# Patient Record
Sex: Female | Born: 1995 | Race: White | Hispanic: No | Marital: Married | Smoking: Never smoker
Health system: Southern US, Community
[De-identification: ages and names within clinical notes are randomized; demographics above are authoritative.]

## PROBLEM LIST (undated history)

## (undated) DIAGNOSIS — F419 Anxiety disorder, unspecified: Secondary | ICD-10-CM

## (undated) DIAGNOSIS — F32A Depression, unspecified: Secondary | ICD-10-CM

## (undated) DIAGNOSIS — Z01118 Encounter for examination of ears and hearing with other abnormal findings: Secondary | ICD-10-CM

## (undated) HISTORY — DX: Depression, unspecified: F32.A

## (undated) HISTORY — DX: Anxiety disorder, unspecified: F41.9

## (undated) HISTORY — DX: Encounter for examination of ears and hearing with other abnormal findings: Z01.118

---

## 2019-05-09 ENCOUNTER — Ambulatory Visit (LOCAL_COMMUNITY_HEALTH_CENTER): Payer: 59

## 2019-05-09 ENCOUNTER — Other Ambulatory Visit: Payer: Self-pay

## 2019-05-09 VITALS — BP 112/80 | Ht 61.0 in | Wt 137.0 lb

## 2019-05-09 DIAGNOSIS — Z30013 Encounter for initial prescription of injectable contraceptive: Secondary | ICD-10-CM

## 2019-05-09 DIAGNOSIS — Z3009 Encounter for other general counseling and advice on contraception: Secondary | ICD-10-CM

## 2019-05-09 MED ORDER — MEDROXYPROGESTERONE ACETATE 150 MG/ML IM SUSP
150.0000 mg | Freq: Once | INTRAMUSCULAR | Status: AC
  Administered 2019-05-09: 150 mg via INTRAMUSCULAR

## 2019-05-09 NOTE — Progress Notes (Signed)
When specifically asked, client denied allergy to medicine or food but per Epic, Penicillin allergy. Yellow sticky noted to verify at next appt. Depo administered without difficulty per 06/29/2019 order of Antoine Primas PA. Client tolerated Depo without complaint. Folic acid counseling completed. Per client, has full bottle at home that she previously received from [facility]. Encouraged to take ond MVI daily with food. Rich Number, RN

## 2019-07-26 ENCOUNTER — Other Ambulatory Visit: Payer: Self-pay

## 2019-07-26 ENCOUNTER — Ambulatory Visit (LOCAL_COMMUNITY_HEALTH_CENTER): Payer: 59 | Admitting: Advanced Practice Midwife

## 2019-07-26 ENCOUNTER — Encounter: Payer: Self-pay | Admitting: Advanced Practice Midwife

## 2019-07-26 VITALS — BP 132/82 | Ht 63.0 in | Wt 136.0 lb

## 2019-07-26 DIAGNOSIS — F329 Major depressive disorder, single episode, unspecified: Secondary | ICD-10-CM | POA: Insufficient documentation

## 2019-07-26 DIAGNOSIS — Z30013 Encounter for initial prescription of injectable contraceptive: Secondary | ICD-10-CM

## 2019-07-26 DIAGNOSIS — Z3042 Encounter for surveillance of injectable contraceptive: Secondary | ICD-10-CM

## 2019-07-26 DIAGNOSIS — Z3009 Encounter for other general counseling and advice on contraception: Secondary | ICD-10-CM

## 2019-07-26 DIAGNOSIS — F32A Depression, unspecified: Secondary | ICD-10-CM

## 2019-07-26 MED ORDER — MEDROXYPROGESTERONE ACETATE 150 MG/ML IM SUSP
150.0000 mg | Freq: Once | INTRAMUSCULAR | Status: AC
  Administered 2019-07-26: 150 mg via INTRAMUSCULAR

## 2019-07-26 NOTE — Progress Notes (Signed)
Patient here for on time Depo; needs to see provider for order. Aileen Fass, RN

## 2019-07-26 NOTE — Progress Notes (Signed)
   [place] problem visit  [place] [facility]  Subjective:  Tasha Foster is a 23 y.o. being seen today for DMPA  Chief Complaint  Patient presents with  . Contraception    Depo    HPI  LMP 07/17/19.  Last sex 07/11/19.  Nonsmoker.  Last pap 04/2018 Tasha Foster Does the patient have a current or past history of drug use? No   No components found for: HCV]   Health Maintenance Due  Topic Date Due  . HIV Screening  09/05/2010  . TETANUS/TDAP  09/05/2014  . PAP-Cervical Cytology Screening  09/05/2016  . PAP SMEAR-Modifier  09/05/2016  . INFLUENZA VACCINE  04/14/2019    ROS  The following portions of the patient's history were reviewed and updated as appropriate: allergies, current medications, past family history, past medical history, past social history, past surgical history and problem list. Problem list updated.   See flowsheet for other program required questions.  Objective:   Vitals:   07/26/19 1030  BP: 132/82  Weight: 136 lb (61.7 kg)  Height: 5\' 3"  (1.6 m)    Physical Exam  n/a  Assessment and Plan:  Tasha Foster is a 23 y.o. female presenting to the [facility] for a Women's Health problem visit  1. Family planning ROI for pap 04/2018 Tasha Foster May have DMPA 150 mg IM x1 - medroxyPROGESTERone (DEPO-PROVERA) injection 150 mg  2. Encounter for surveillance of injectable contraceptive Happy with DMPA     No follow-ups on file.  No future appointments.  Herbie Saxon, CNM

## 2019-07-27 NOTE — Progress Notes (Signed)
Pt received Depo 150mg  IM per provider order and pt tolerated well. ROI for [facility] obtained and signed by pt per provider order. ROI faxed and fax confirmation received. Provider orders completed.Ronny Bacon, RN

## 2019-08-23 ENCOUNTER — Encounter: Payer: Self-pay | Admitting: Physician Assistant

## 2019-08-23 LAB — HM PAP SMEAR

## 2019-10-11 ENCOUNTER — Other Ambulatory Visit: Payer: Self-pay | Admitting: Physician Assistant

## 2019-10-11 DIAGNOSIS — Z3042 Encounter for surveillance of injectable contraceptive: Secondary | ICD-10-CM

## 2019-10-11 MED ORDER — MEDROXYPROGESTERONE ACETATE 150 MG/ML IM SUSP
150.0000 mg | INTRAMUSCULAR | Status: DC
  Administered 2019-10-12: 08:00:00 150 mg via INTRAMUSCULAR

## 2019-10-11 NOTE — Progress Notes (Signed)
Per chart, last RP 06/2018 with pap at [facility] 2019 and next due 2023.  Provided that patient desires to continue with Depo and BP is normal, ok to continue Depo for 1 year.

## 2019-10-12 ENCOUNTER — Ambulatory Visit (LOCAL_COMMUNITY_HEALTH_CENTER): Payer: Medicaid Other

## 2019-10-12 ENCOUNTER — Other Ambulatory Visit: Payer: Self-pay

## 2019-10-12 VITALS — BP 127/78 | Ht 63.0 in | Wt 131.0 lb

## 2019-10-12 DIAGNOSIS — Z3042 Encounter for surveillance of injectable contraceptive: Secondary | ICD-10-CM

## 2019-10-12 DIAGNOSIS — Z30013 Encounter for initial prescription of injectable contraceptive: Secondary | ICD-10-CM | POA: Diagnosis not present

## 2019-10-12 DIAGNOSIS — Z3009 Encounter for other general counseling and advice on contraception: Secondary | ICD-10-CM

## 2019-10-12 NOTE — Progress Notes (Signed)
Depo given per C. Rolley Sims PA order; tolerated well Richmond Campbell, RN

## 2020-01-02 ENCOUNTER — Other Ambulatory Visit: Payer: Self-pay

## 2020-01-02 ENCOUNTER — Ambulatory Visit (LOCAL_COMMUNITY_HEALTH_CENTER): Payer: 59

## 2020-01-02 DIAGNOSIS — Z3042 Encounter for surveillance of injectable contraceptive: Secondary | ICD-10-CM

## 2020-01-02 DIAGNOSIS — Z30013 Encounter for initial prescription of injectable contraceptive: Secondary | ICD-10-CM | POA: Diagnosis not present

## 2020-01-02 DIAGNOSIS — Z3009 Encounter for other general counseling and advice on contraception: Secondary | ICD-10-CM

## 2020-01-02 NOTE — Progress Notes (Signed)
Depo given (left delt) per Rolley Sims order; tolerated well Richmond Campbell, RN

## 2020-03-26 ENCOUNTER — Ambulatory Visit (INDEPENDENT_AMBULATORY_CARE_PROVIDER_SITE_OTHER): Payer: 59 | Admitting: Obstetrics and Gynecology

## 2020-03-26 ENCOUNTER — Other Ambulatory Visit: Payer: Self-pay

## 2020-03-26 ENCOUNTER — Encounter: Payer: Self-pay | Admitting: Obstetrics and Gynecology

## 2020-03-26 VITALS — BP 158/89 | HR 112 | Ht 60.0 in | Wt 130.0 lb

## 2020-03-26 DIAGNOSIS — Z3169 Encounter for other general counseling and advice on procreation: Secondary | ICD-10-CM | POA: Diagnosis not present

## 2020-03-26 NOTE — Progress Notes (Signed)
HPI:      [name] is a 24 y.o. No obstetric history on file. who LMP was No LMP recorded. Patient has had an injection.  Subjective:   She presents today because she has been using Depo for multiple years consecutively and has now decided to attempt pregnancy.  She would like preconception counseling and would like to discuss ovulation prediction and timing of intercourse for pregnancy. She has not previously been pregnant and her partner has never fathered a child.  Prior to Depo she had regular cycles. Since discontinuation of Depo in April she has had 1 menstrual period.    Hx: The following portions of the patient's history were reviewed and updated as appropriate:             She  has a past medical history of Anxiety and Depression. She does not have any pertinent problems on file. She  has no past surgical history on file. Her family history includes Clotting disorder in her paternal grandfather; Diabetes in her paternal grandfather; Hepatitis in her mother; Hypertension in her father and paternal grandfather; Migraines in her father, maternal grandfather, maternal grandmother, mother, paternal grandfather, and paternal grandmother; Mood Disorder in her father; Seizures in her mother. She  reports that she has never smoked. She has never used smokeless tobacco. She reports current alcohol use. She reports previous drug use. She has a current medication list which includes the following prescription(s): hydroxyzine and venlafaxine xr, and the following Facility-Administered Medications: medroxyprogesterone. She is allergic to amoxicillin and penicillins.       Review of Systems:  Review of Systems  Constitutional: Denied constitutional symptoms, night sweats, recent illness, fatigue, fever, insomnia and weight loss.  Eyes: Denied eye symptoms, eye pain, photophobia, vision change and visual disturbance.  Ears/Nose/Throat/Neck: Denied ear, nose, throat or neck symptoms,  hearing loss, nasal discharge, sinus congestion and sore throat.  Cardiovascular: Denied cardiovascular symptoms, arrhythmia, chest pain/pressure, edema, exercise intolerance, orthopnea and palpitations.  Respiratory: Denied pulmonary symptoms, asthma, pleuritic pain, productive sputum, cough, dyspnea and wheezing.  Gastrointestinal: Denied, gastro-esophageal reflux, melena, nausea and vomiting.  Genitourinary: Denied genitourinary symptoms including symptomatic vaginal discharge, pelvic relaxation issues, and urinary complaints.  Musculoskeletal: Denied musculoskeletal symptoms, stiffness, swelling, muscle weakness and myalgia.  Dermatologic: Denied dermatology symptoms, rash and scar.  Neurologic: Denied neurology symptoms, dizziness, headache, neck pain and syncope.  Psychiatric: Denied psychiatric symptoms, anxiety and depression.  Endocrine: Denied endocrine symptoms including hot flashes and night sweats.   Meds:   Current Outpatient Medications on File Prior to Visit  Medication Sig Dispense Refill  . hydrOXYzine (ATARAX/VISTARIL) 50 MG tablet SMARTSIG:0.5 Tablet(s) By Mouth 1 to 3 Times Daily PRN    . venlafaxine XR (EFFEXOR-XR) 37.5 MG 24 hr capsule Take by mouth.     Current Facility-Administered Medications on File Prior to Visit  Medication Dose Route Frequency Provider Last Rate Last Admin  . medroxyPROGESTERone (DEPO-PROVERA) injection 150 mg  150 mg Intramuscular Q90 days Matt Holmes, [place]   150 mg at 10/12/19 6270    Objective:     Vitals:   03/26/20 1010  BP: (!) 158/89  Pulse: (!) 112                Assessment:    No obstetric history on file. Patient Active Problem List   Diagnosis Date Noted  . Depression/anxiety 07/26/2019     1. Pre-conception counseling     Likely her first period was not ovulatory.  But  will employ expectant management with the expectation that within the next 6 months that she will have normal monthly menses.   Plan:             1.  Preconceptual counseling.  We have discussed the timing of intercourse, how to count cycle days, when the most fertile days occur, how to use ovulation predictor kits, how to increase sperm counts by intercourse every other day, possible future work-up if cycles do not become regular, the effect of Depo on future ovulations, use of prenatal vitamins prior to conception, first trimester prenatal care. All questions answered. Orders No orders of the defined types were placed in this encounter.   No orders of the defined types were placed in this encounter.     F/U  Return in about 6 months (around 09/26/2020). I spent 33 minutes involved in the care of this patient preparing to see the patient by obtaining and reviewing her medical history (including labs, imaging tests and prior procedures), documenting clinical information in the electronic health record (EHR), counseling and coordinating care plans, writing and sending prescriptions, ordering tests or procedures and directly communicating with the patient by discussing pertinent items from her history and physical exam as well as detailing my assessment and plan as noted above so that she has an informed understanding.  All of her questions were answered.  Elonda Husky, M.D. 03/26/2020 11:03 AM

## 2020-05-02 NOTE — Telephone Encounter (Signed)
Pt sent in an appt request. Called pt and made lab appt. The pt is requesting a call back from the nurse before her lab appt on Tuesday. I told her I will send a message to the nurse. The pt verbally understood. Please advise

## 2020-05-05 NOTE — Telephone Encounter (Signed)
Scheduled patient to come in to discuss questions with Dr. Logan Bores.

## 2020-05-06 ENCOUNTER — Ambulatory Visit (INDEPENDENT_AMBULATORY_CARE_PROVIDER_SITE_OTHER): Payer: 59 | Admitting: Obstetrics and Gynecology

## 2020-05-06 ENCOUNTER — Other Ambulatory Visit: Payer: 59

## 2020-05-06 ENCOUNTER — Other Ambulatory Visit: Payer: Self-pay

## 2020-05-06 ENCOUNTER — Encounter: Payer: Self-pay | Admitting: Obstetrics and Gynecology

## 2020-05-06 VITALS — BP 125/83 | HR 111 | Ht 60.0 in | Wt 140.0 lb

## 2020-05-06 DIAGNOSIS — R7989 Other specified abnormal findings of blood chemistry: Secondary | ICD-10-CM

## 2020-05-06 DIAGNOSIS — R799 Abnormal finding of blood chemistry, unspecified: Secondary | ICD-10-CM | POA: Diagnosis not present

## 2020-05-06 NOTE — Progress Notes (Signed)
HPI:      Tasha Foster is a 24 y.o. No obstetric history on file. who LMP was No LMP recorded. Patient has had an injection.  Subjective:   She presents today because she has been attempting pregnancy and has used ovulation predictor kit.  She states that every day of her cycle shows elevated fertility. (Ovulation predictor kits measure luteinizing hormone -thus the assumption is that she has persistent elevated LH.) She does state that she is having menstrual periods 1 time per month.    Hx: The following portions of the patient's history were reviewed and updated as appropriate:             She  has a past medical history of Anxiety and Depression. She does not have any pertinent problems on file. She  has no past surgical history on file. Her family history includes Clotting disorder in her paternal grandfather; Diabetes in her paternal grandfather; Hepatitis in her mother; Hypertension in her father and paternal grandfather; Migraines in her father, maternal grandfather, maternal grandmother, mother, paternal grandfather, and paternal grandmother; Mood Disorder in her father; Seizures in her mother. She  reports that she has never smoked. She has never used smokeless tobacco. She reports current alcohol use. She reports previous drug use. She has a current medication list which includes the following prescription(s): hydroxyzine and venlafaxine xr, and the following Facility-Administered Medications: medroxyprogesterone. She is allergic to amoxicillin and penicillins.       Review of Systems:  Review of Systems  Constitutional: Denied constitutional symptoms, night sweats, recent illness, fatigue, fever, insomnia and weight loss.  Eyes: Denied eye symptoms, eye pain, photophobia, vision change and visual disturbance.  Ears/Nose/Throat/Neck: Denied ear, nose, throat or neck symptoms, hearing loss, nasal discharge, sinus congestion and sore throat.  Cardiovascular: Denied  cardiovascular symptoms, arrhythmia, chest pain/pressure, edema, exercise intolerance, orthopnea and palpitations.  Respiratory: Denied pulmonary symptoms, asthma, pleuritic pain, productive sputum, cough, dyspnea and wheezing.  Gastrointestinal: Denied, gastro-esophageal reflux, melena, nausea and vomiting.  Genitourinary: Denied genitourinary symptoms including symptomatic vaginal discharge, pelvic relaxation issues, and urinary complaints.  Musculoskeletal: Denied musculoskeletal symptoms, stiffness, swelling, muscle weakness and myalgia.  Dermatologic: Denied dermatology symptoms, rash and scar.  Neurologic: Denied neurology symptoms, dizziness, headache, neck pain and syncope.  Psychiatric: Denied psychiatric symptoms, anxiety and depression.  Endocrine: Denied endocrine symptoms including hot flashes and night sweats.   Meds:   Current Outpatient Medications on File Prior to Visit  Medication Sig Dispense Refill  . hydrOXYzine (ATARAX/VISTARIL) 50 MG tablet SMARTSIG:0.5 Tablet(s) By Mouth 1 to 3 Times Daily PRN    . venlafaxine XR (EFFEXOR-XR) 37.5 MG 24 hr capsule Take by mouth.     Current Facility-Administered Medications on File Prior to Visit  Medication Dose Route Frequency Provider Last Rate Last Admin  . medroxyPROGESTERone (DEPO-PROVERA) injection 150 mg  150 mg Intramuscular Q90 days Jerene Dilling, [place]   150 mg at 10/12/19 3491    Objective:     Vitals:   05/06/20 0850  BP: 125/83  Pulse: (!) 111                Assessment:    No obstetric history on file. Patient Active Problem List   Diagnosis Date Noted  . Depression/anxiety 07/26/2019     1. Abnormal pituitary luteinizing hormone (LH)     Assumption because ovulation predictor kit shows constant fertility.   Plan:            1.  PCO labs because this is a common reason for elevated LH.  2.  If PCO labs normal consider timing intercourse based only on cycle.  Then performed progesterone level in  luteal phase to confirm ovulation.  We have discussed PCO in detail.  Possible treatment options discussed.  All questions answered. Orders Orders Placed This Encounter  Procedures  . Testosterone,Free and Total  . TSH  . Glucose, random  . Prolactin  . FSH  . LH  . DHEA-sulfate  . Insulin, random    No orders of the defined types were placed in this encounter.     F/U  Return for We will contact her with any abnormal test results. I spent 22 minutes involved in the care of this patient preparing to see the patient by obtaining and reviewing her medical history (including labs, imaging tests and prior procedures), documenting clinical information in the electronic health record (EHR), counseling and coordinating care plans, writing and sending prescriptions, ordering tests or procedures and directly communicating with the patient by discussing pertinent items from her history and physical exam as well as detailing my assessment and plan as noted above so that she has an informed understanding.  All of her questions were answered.  Finis Bud, M.D. 05/06/2020 9:22 AM

## 2020-05-10 LAB — TSH: TSH: 0.607 u[IU]/mL (ref 0.450–4.500)

## 2020-05-10 LAB — LUTEINIZING HORMONE: LH: 6.7 m[IU]/mL

## 2020-05-10 LAB — TESTOSTERONE,FREE AND TOTAL
Testosterone, Free: 4.2 pg/mL (ref 0.0–4.2)
Testosterone: 29 ng/dL (ref 13–71)

## 2020-05-10 LAB — DHEA-SULFATE: DHEA-SO4: 210 ug/dL (ref 110.0–431.7)

## 2020-05-10 LAB — INSULIN, RANDOM: INSULIN: 18.1 u[IU]/mL (ref 2.6–24.9)

## 2020-05-10 LAB — GLUCOSE, RANDOM: Glucose: 94 mg/dL (ref 65–99)

## 2020-05-10 LAB — FOLLICLE STIMULATING HORMONE: FSH: 5.6 m[IU]/mL

## 2020-05-14 NOTE — Telephone Encounter (Signed)
Pt called in and stated that she got her lab work back on [place], and that she would like a call back from the nurse. I told the pt I will send a message to the nurse and someone will be in touched. Please advise

## 2020-07-29 ENCOUNTER — Other Ambulatory Visit: Payer: 59

## 2020-07-29 ENCOUNTER — Other Ambulatory Visit: Payer: Self-pay

## 2020-07-29 DIAGNOSIS — R7989 Other specified abnormal findings of blood chemistry: Secondary | ICD-10-CM

## 2020-07-29 DIAGNOSIS — Z3009 Encounter for other general counseling and advice on contraception: Secondary | ICD-10-CM

## 2020-07-30 LAB — PROGESTERONE: Progesterone: 0.8 ng/mL

## 2020-08-04 ENCOUNTER — Encounter: Payer: 59 | Admitting: Obstetrics and Gynecology

## 2020-08-05 ENCOUNTER — Encounter: Payer: 59 | Admitting: Obstetrics and Gynecology

## 2020-09-13 NOTE — L&D Delivery Note (Signed)
Delivery Summary for Kewanna Calbert  Labor Events:   Preterm labor: No data found  Rupture date: 05/02/2021  Rupture time: 9:07 AM  Rupture type: Artificial  Fluid Color: Clear White Bloody  Induction: No data found  Augmentation: No data found  Complications: No data found  Cervical ripening: No data found No data found   No data found     Delivery:   Episiotomy: No data found  Lacerations: No data found  Repair suture: No data found  Repair # of packets: No data found  Blood loss (ml): No data found   Information for the patient's newborn:  Esabella, Stockinger [[id number]]   Delivery 05/02/2021 6:26 PM by  C-Section, Low Transverse Sex:  female Gestational Age: [redacted]w[redacted]d Delivery Clinician:   Living?:         APGARS  One minute Five minutes Ten minutes  Skin color:        Heart rate:        Grimace:        Muscle tone:        Breathing:        Totals: 7  8      Presentation/position:      Resuscitation:   Cord information:    Disposition of cord blood:     Blood gases sent?  Complications:   Placenta: Delivered:       appearance Newborn Measurements: Weight: 6 lb 7.7 oz (2940 g)  Height: 19.29"  Head circumference:    Chest circumference:    Other providers:    Additional  information: Forceps:   Vacuum:   Breech:   Observed anomalies       See Dr. Oretha Milch operative note for details of procedure.    Hildred Laser, MD [facility]

## 2020-09-18 ENCOUNTER — Other Ambulatory Visit: Payer: Self-pay

## 2020-09-18 ENCOUNTER — Ambulatory Visit (INDEPENDENT_AMBULATORY_CARE_PROVIDER_SITE_OTHER): Payer: 59 | Admitting: Obstetrics and Gynecology

## 2020-09-18 ENCOUNTER — Encounter: Payer: Self-pay | Admitting: Obstetrics and Gynecology

## 2020-09-18 VITALS — BP 143/95 | HR 120 | Ht 60.0 in | Wt 154.8 lb

## 2020-09-18 DIAGNOSIS — N926 Irregular menstruation, unspecified: Secondary | ICD-10-CM

## 2020-09-18 LAB — POCT URINE PREGNANCY: Preg Test, Ur: POSITIVE — AB

## 2020-09-18 NOTE — Addendum Note (Signed)
Addended by: Elonda Husky on: 09/18/2020 11:04 AM   Modules accepted: Orders

## 2020-09-18 NOTE — Progress Notes (Signed)
HPI:      [name] is a 25 y.o. G1P0 who LMP was Patient's last menstrual period was 08/07/2020.  Subjective:   She presents today after missing her menstrual period and having a home positive pregnancy test.  She was attempting pregnancy for the last few months.  She has been using ovulation predictor kits.  She is very excited about being pregnant.  Obviously this is a planned pregnancy.  She is currently taking prenatal vitamins. Patient is experiencing daily nausea. Patient has not been vaccinated for Covid.    Hx: The following portions of the patient's history were reviewed and updated as appropriate:             She  has a past medical history of Anxiety and Depression. She does not have any pertinent problems on file. She  has no past surgical history on file. Her family history includes Clotting disorder in her paternal grandfather; Diabetes in her paternal grandfather; Hepatitis in her mother; Hypertension in her father and paternal grandfather; Migraines in her father, maternal grandfather, maternal grandmother, mother, paternal grandfather, and paternal grandmother; Mood Disorder in her father; Seizures in her mother. She  reports that she has never smoked. She has never used smokeless tobacco. She reports current alcohol use. She reports previous drug use. She has a current medication list which includes the following prescription(s): multivitamin-prenatal. She is allergic to amoxicillin and penicillins.       Review of Systems:  Review of Systems  Constitutional: Denied constitutional symptoms, night sweats, recent illness, fatigue, fever, insomnia and weight loss.  Eyes: Denied eye symptoms, eye pain, photophobia, vision change and visual disturbance.  Ears/Nose/Throat/Neck: Denied ear, nose, throat or neck symptoms, hearing loss, nasal discharge, sinus congestion and sore throat.  Cardiovascular: Denied cardiovascular symptoms, arrhythmia, chest pain/pressure,  edema, exercise intolerance, orthopnea and palpitations.  Respiratory: Denied pulmonary symptoms, asthma, pleuritic pain, productive sputum, cough, dyspnea and wheezing.  Gastrointestinal: Denied, gastro-esophageal reflux, melena, nausea and vomiting.  Genitourinary: Denied genitourinary symptoms including symptomatic vaginal discharge, pelvic relaxation issues, and urinary complaints.  Musculoskeletal: Denied musculoskeletal symptoms, stiffness, swelling, muscle weakness and myalgia.  Dermatologic: Denied dermatology symptoms, rash and scar.  Neurologic: Denied neurology symptoms, dizziness, headache, neck pain and syncope.  Psychiatric: Denied psychiatric symptoms, anxiety and depression.  Endocrine: Denied endocrine symptoms including hot flashes and night sweats.   Meds:   Current Outpatient Medications on File Prior to Visit  Medication Sig Dispense Refill  . Prenatal Vit-Fe Fumarate-FA (MULTIVITAMIN-PRENATAL) 27-0.8 MG TABS tablet Take 1 tablet by mouth daily at 12 noon.     No current facility-administered medications on file prior to visit.          Objective:     Vitals:   09/18/20 0828  BP: (!) 143/95  Pulse: (!) 120   Filed Weights   09/18/20 0828  Weight: 154 lb 12.8 oz (70.2 kg)              Urinary pregnancy test positive  Assessment:    G1P0 Patient Active Problem List   Diagnosis Date Noted  . Depression/anxiety 07/26/2019     1. Missed period     Positive pregnancy test-patient was attempting.  Approximately 6 weeks estimated gestational age based on last menstrual period.   Plan:            Prenatal Plan 1.  The patient was given prenatal literature. 2.  She was continued on prenatal vitamins. 3.  A prenatal lab panel  to be drawn at nurse visit. 4.  An ultrasound was ordered to better determine an EDC. 5.  A nurse visit was scheduled. 6.  Genetic testing and testing for other inheritable conditions discussed in detail. She will decide in  the future whether to have these labs performed. 7.  A general overview of pregnancy testing, visit schedule, ultrasound schedule, and prenatal care was discussed. 8.  COVID and its risks associated with pregnancy, prevention by limiting exposure and use of masks, as well as the risks and benefits of vaccination during pregnancy were discussed in detail.  [facility] policy regarding office and hospital visitation and testing was explained. 9.  Benefits of breast-feeding discussed in detail including both maternal and infant benefits. Ready Set Baby website discussed. 10.  Nausea and vomiting of pregnancy discussed-literature given.   Orders Orders Placed This Encounter  Procedures  . POCT urine pregnancy    No orders of the defined types were placed in this encounter.     F/U  Return in about 6 weeks (around 10/30/2020). I spent 24 minutes involved in the care of this patient preparing to see the patient by obtaining and reviewing her medical history (including labs, imaging tests and prior procedures), documenting clinical information in the electronic health record (EHR), counseling and coordinating care plans, writing and sending prescriptions, ordering tests or procedures and directly communicating with the patient by discussing pertinent items from her history and physical exam as well as detailing my assessment and plan as noted above so that she has an informed understanding.  All of her questions were answered.  Elonda Husky, M.D. 09/18/2020 9:00 AM

## 2020-09-24 ENCOUNTER — Ambulatory Visit (INDEPENDENT_AMBULATORY_CARE_PROVIDER_SITE_OTHER): Payer: 59

## 2020-09-24 ENCOUNTER — Other Ambulatory Visit: Payer: Self-pay

## 2020-09-24 DIAGNOSIS — Z3A01 Less than 8 weeks gestation of pregnancy: Secondary | ICD-10-CM

## 2020-09-24 DIAGNOSIS — N926 Irregular menstruation, unspecified: Secondary | ICD-10-CM | POA: Diagnosis not present

## 2020-10-10 ENCOUNTER — Other Ambulatory Visit: Payer: Self-pay

## 2020-10-10 ENCOUNTER — Ambulatory Visit: Payer: 59

## 2020-10-10 VITALS — BP 128/83 | HR 95 | Ht 60.0 in | Wt 153.2 lb

## 2020-10-10 DIAGNOSIS — Z3A09 9 weeks gestation of pregnancy: Secondary | ICD-10-CM

## 2020-10-10 NOTE — Progress Notes (Unsigned)
Tasha Foster presents for NOB nurse interview visit. LMP 08/07/20.Pregnancy confirmation done 09/18/20- Dr Logan Bores. Dating and viability ultrasound 09/24/20- 6 weeks EDD 05/20/21.  G- 1.  P- 0 0 0 0   . Pregnancy education material explained and given. 0 cats in the home. NOB labs ordered. TSH/HbgA1c due to Increased BMI. HIV labs and Drug screen were explained optional and she did not decline. Drug screen ordered. PNV encouraged. Genetic screening options discussed. Genetic testing: to be ordered at [facility] per patient.  Pt may discuss with provider. Pt. To follow up with provider in 3  weeks for NOB physical 10/30/20.  All questions answered. FMLA paperwork explained and signed. Financial policy reviewed and understood.

## 2020-10-11 LAB — RPR: RPR Ser Ql: NONREACTIVE

## 2020-10-11 LAB — VIRAL HEPATITIS HBV, HCV
HCV Ab: <0.1 s/co ratio (ref 0.0–0.9)
Hep B Core Total Ab: NEGATIVE
Hep B Surface Ab, Qual: NONREACTIVE
Hepatitis B Surface Ag: NEGATIVE

## 2020-10-11 LAB — CBC WITH DIFFERENTIAL
Basophils Absolute: 0.1 10*3/uL (ref 0.0–0.2)
Basos: 1 %
EOS (ABSOLUTE): 0.1 10*3/uL (ref 0.0–0.4)
Eos: 2 %
Hematocrit: 45 % (ref 34.0–46.6)
Hemoglobin: 15.1 g/dL (ref 11.1–15.9)
Immature Grans (Abs): 0.1 10*3/uL (ref 0.0–0.1)
Immature Granulocytes: 1 %
Lymphocytes Absolute: 1.4 10*3/uL (ref 0.7–3.1)
Lymphs: 16 %
MCH: 30.6 pg (ref 26.6–33.0)
MCHC: 33.6 g/dL (ref 31.5–35.7)
MCV: 91 fL (ref 79–97)
Monocytes Absolute: 0.9 10*3/uL (ref 0.1–0.9)
Monocytes: 10 %
Neutrophils Absolute: 6.4 10*3/uL (ref 1.4–7.0)
Neutrophils: 70 %
RBC: 4.93 x10E6/uL (ref 3.77–5.28)
RDW: 12.4 % (ref 11.7–15.4)
WBC: 8.9 10*3/uL (ref 3.4–10.8)

## 2020-10-11 LAB — URINALYSIS, ROUTINE W REFLEX MICROSCOPIC
Bilirubin, UA: NEGATIVE
Glucose, UA: NEGATIVE
Ketones, UA: NEGATIVE
Leukocytes,UA: NEGATIVE
Nitrite, UA: NEGATIVE
Protein,UA: NEGATIVE
RBC, UA: NEGATIVE
Specific Gravity, UA: 1.024 (ref 1.005–1.030)
Urobilinogen, Ur: 0.2 mg/dL (ref 0.2–1.0)
pH, UA: 6.5 (ref 5.0–7.5)

## 2020-10-11 LAB — HIV ANTIBODY (ROUTINE TESTING W REFLEX): HIV Screen 4th Generation wRfx: NONREACTIVE

## 2020-10-11 LAB — TSH: TSH: 0.559 u[IU]/mL (ref 0.450–4.500)

## 2020-10-11 LAB — RUBELLA SCREEN: Rubella Antibodies, IGG: 2.89 index (ref >0.99)

## 2020-10-11 LAB — HEMOGLOBIN A1C
Est. average glucose Bld gHb Est-mCnc: 100 mg/dL
Hgb A1c MFr Bld: 5.1 % (ref 4.8–5.6)

## 2020-10-11 LAB — ABO AND RH: Rh Factor: POSITIVE

## 2020-10-11 LAB — HCV INTERPRETATION

## 2020-10-11 LAB — VARICELLA ZOSTER ANTIBODY, IGG: Varicella zoster IgG: <135 index — ABNORMAL LOW (ref >165)

## 2020-10-11 LAB — ANTIBODY SCREEN: Antibody Screen: NEGATIVE

## 2020-10-12 LAB — GC/CHLAMYDIA PROBE AMP
Chlamydia trachomatis, NAA: NEGATIVE
Neisseria Gonorrhoeae by PCR: NEGATIVE

## 2020-10-14 LAB — URINE CULTURE

## 2020-10-28 LAB — MONITOR DRUG PROFILE 14(MW)
Amphetamine Scrn, Ur: NEGATIVE ng/mL
BARBITURATE SCREEN URINE: NEGATIVE ng/mL
BENZODIAZEPINE SCREEN, URINE: NEGATIVE ng/mL
Buprenorphine, Urine: NEGATIVE ng/mL
Cocaine (Metab) Scrn, Ur: NEGATIVE ng/mL
Creatinine(Crt), U: 200.7 mg/dL (ref 20.0–300.0)
Fentanyl, Urine: NEGATIVE pg/mL
Meperidine Screen, Urine: NEGATIVE ng/mL
Methadone Screen, Urine: NEGATIVE ng/mL
OXYCODONE+OXYMORPHONE UR QL SCN: NEGATIVE ng/mL
Opiate Scrn, Ur: NEGATIVE ng/mL
Ph of Urine: 6.7 (ref 4.5–8.9)
Phencyclidine Qn, Ur: NEGATIVE ng/mL
Propoxyphene Scrn, Ur: NEGATIVE ng/mL
SPECIFIC GRAVITY: 1.023
Tramadol Screen, Urine: NEGATIVE ng/mL

## 2020-10-28 LAB — CANNABINOID (GC/MS), URINE
Cannabinoid: POSITIVE — AB
Carboxy THC (GC/MS): >750 ng/mL

## 2020-10-30 ENCOUNTER — Ambulatory Visit (INDEPENDENT_AMBULATORY_CARE_PROVIDER_SITE_OTHER): Payer: Self-pay | Admitting: Obstetrics and Gynecology

## 2020-10-30 ENCOUNTER — Other Ambulatory Visit: Payer: Self-pay

## 2020-10-30 ENCOUNTER — Encounter: Payer: Self-pay | Admitting: Obstetrics and Gynecology

## 2020-10-30 VITALS — BP 137/87 | HR 103 | Wt 152.4 lb

## 2020-10-30 DIAGNOSIS — Z3491 Encounter for supervision of normal pregnancy, unspecified, first trimester: Secondary | ICD-10-CM

## 2020-10-30 DIAGNOSIS — Z3A12 12 weeks gestation of pregnancy: Secondary | ICD-10-CM

## 2020-10-30 LAB — POCT URINALYSIS DIPSTICK OB
Bilirubin, UA: NEGATIVE
Blood, UA: NEGATIVE
Glucose, UA: NEGATIVE
Leukocytes, UA: NEGATIVE
Nitrite, UA: NEGATIVE
Spec Grav, UA: 1.015 (ref 1.010–1.025)
Urobilinogen, UA: 0.2 E.U./dL
pH, UA: 7 (ref 5.0–8.0)

## 2020-10-30 NOTE — Progress Notes (Signed)
NOB: Patient starting to feel better-decreased nausea and vomiting.  Taking vitamins as directed.  No complaints.  She and her husband had COVID but is now feeling better.  Have questions about transfer to midwifery care.  Discussed in detail.  At this time will remain with MDs but if she changes her mind she will inform us at that time. Return for blood work including MaterniT 21 aFP next visit  Physical examination General NAD, Conversant  HEENT Atraumatic; Op clear with mmm.  Normo-cephalic. Pupils reactive. Anicteric sclerae  Thyroid/Neck Smooth without nodularity or enlargement. Normal ROM.  Neck Supple.  Skin No rashes, lesions or ulceration. Normal palpated skin turgor. No nodularity.  Breasts: No masses or discharge.  Symmetric.  No axillary adenopathy.  Lungs: Clear to auscultation.No rales or wheezes. Normal Respiratory effort, no retractions.  Heart: NSR.  No murmurs or rubs appreciated. No periferal edema  Abdomen: Soft.  Non-tender.  No masses.  No HSM. No hernia  Extremities: Moves all appropriately.  Normal ROM for age. No lymphadenopathy.  Neuro: Oriented to PPT.  Normal mood. Normal affect.     Pelvic:   Vulva: Normal appearance.  No lesions.  Vagina: No lesions or abnormalities noted.  Support: Normal pelvic support.  Urethra No masses tenderness or scarring.  Meatus Normal size without lesions or prolapse.  Cervix: Normal appearance.  No lesions.  Anus: Normal exam.  No lesions.  Perineum: Normal exam.  No lesions.        Bimanual   Adnexae: No masses.  Non-tender to palpation.  Uterus: Enlarged. POS FHTs  Non-tender.  Mobile.  AV.  Adnexae: No masses.  Non-tender to palpation.  Cul-de-sac: Negative for abnormality.  Adnexae: No masses.  Non-tender to palpation.         Pelvimetry   Diagonal: Reached.  Spines: Average.  Sacrum: Concave.  Pubic Arch: Normal.

## 2020-11-10 ENCOUNTER — Other Ambulatory Visit: Payer: Medicaid Other

## 2020-11-10 ENCOUNTER — Other Ambulatory Visit: Payer: Self-pay

## 2020-11-11 LAB — CBC
Hematocrit: 41.5 % (ref 34.0–46.6)
Hemoglobin: 14.3 g/dL (ref 11.1–15.9)
MCH: 30.8 pg (ref 26.6–33.0)
MCHC: 34.5 g/dL (ref 31.5–35.7)
MCV: 89 fL (ref 79–97)
Platelets: 317 10*3/uL (ref 150–450)
RBC: 4.64 x10E6/uL (ref 3.77–5.28)
RDW: 12.7 % (ref 11.7–15.4)
WBC: 9.6 10*3/uL (ref 3.4–10.8)

## 2020-11-15 LAB — MATERNIT21  PLUS CORE+ESS+SCA, BLOOD

## 2020-11-21 ENCOUNTER — Telehealth: Payer: Self-pay | Admitting: Obstetrics and Gynecology

## 2020-11-21 NOTE — Telephone Encounter (Signed)
New message    Patient is asking for the gender results  in an envelope left at the front desk.   Tasha Foster will be coming by the office to retrieval the envelope.   Asking for the call back.

## 2020-11-21 NOTE — Telephone Encounter (Signed)
Envelope placed up front for Tasha Foster to pick up.

## 2020-11-24 ENCOUNTER — Telehealth: Payer: Self-pay | Admitting: Obstetrics and Gynecology

## 2020-11-24 NOTE — Telephone Encounter (Signed)
error 

## 2020-11-26 ENCOUNTER — Encounter: Payer: Self-pay | Admitting: Obstetrics and Gynecology

## 2020-11-26 ENCOUNTER — Other Ambulatory Visit: Payer: Self-pay

## 2020-11-26 ENCOUNTER — Ambulatory Visit (INDEPENDENT_AMBULATORY_CARE_PROVIDER_SITE_OTHER): Payer: 59 | Admitting: Obstetrics and Gynecology

## 2020-11-26 VITALS — BP 124/85 | HR 94 | Ht 60.0 in | Wt 154.5 lb

## 2020-11-26 DIAGNOSIS — Z87898 Personal history of other specified conditions: Secondary | ICD-10-CM | POA: Insufficient documentation

## 2020-11-26 DIAGNOSIS — F32A Depression, unspecified: Secondary | ICD-10-CM

## 2020-11-26 DIAGNOSIS — Z3402 Encounter for supervision of normal first pregnancy, second trimester: Secondary | ICD-10-CM

## 2020-11-26 DIAGNOSIS — Z3A15 15 weeks gestation of pregnancy: Secondary | ICD-10-CM

## 2020-11-26 DIAGNOSIS — Z362 Encounter for other antenatal screening follow-up: Secondary | ICD-10-CM

## 2020-11-26 DIAGNOSIS — F1290 Cannabis use, unspecified, uncomplicated: Secondary | ICD-10-CM | POA: Insufficient documentation

## 2020-11-26 DIAGNOSIS — F419 Anxiety disorder, unspecified: Secondary | ICD-10-CM

## 2020-11-26 LAB — POCT URINALYSIS DIPSTICK OB
Bilirubin, UA: NEGATIVE
Blood, UA: NEGATIVE
Glucose, UA: NEGATIVE
Ketones, UA: NEGATIVE
Leukocytes, UA: NEGATIVE
Nitrite, UA: NEGATIVE
POC,PROTEIN,UA: NEGATIVE
Spec Grav, UA: <=1.005 — AB (ref 1.010–1.025)
Urobilinogen, UA: 0.2 E.U./dL
pH, UA: 7.5 (ref 5.0–8.0)

## 2020-11-26 NOTE — Progress Notes (Signed)
ROB: still having some morning sickness occasionally. Discussed OTC remedies and dietary modifications as it is usually noted if she eats certain things after dinner. Reviewed patient's chart, has h/o anxiety and depression. Notes she was on several meds right before pregnancy, but was also dealing with grief over the sudden loss of her father (from overdose).  Stopped medications and has been doing pretty well so far. Baseline PHQ-9 and GAD-7 done today, negative.  RTC in 1 week for genetic screen AFP (notes she does not do well with blood draws if fasting).  Had normal MaterniT21. RTC in 4 weeks for next OB visit and anatomy scan.

## 2020-11-26 NOTE — Progress Notes (Signed)
OB-Pt present for routine prenatal care. Pt stated she was doing well.

## 2020-11-26 NOTE — Patient Instructions (Signed)
WHAT OB PATIENTS CAN EXPECT   Confirmation of pregnancy and ultrasound ordered if medically indicated-[redacted] weeks gestation  New OB (NOB) intake with nurse and New OB (NOB) labs- [redacted] weeks gestation  New OB (NOB) physical examination with provider- 11/[redacted] weeks gestation  Flu vaccine-[redacted] weeks gestation  Anatomy scan-[redacted] weeks gestation  Glucose tolerance test, blood work to test for anemia, T-dap vaccine-[redacted] weeks gestation  Vaginal swabs/cultures-STD/Group B strep-[redacted] weeks gestation  Appointments every 4 weeks until 28 weeks  Every 2 weeks from 28 weeks until 36 weeks  Weekly visits from 36 weeks until delivery  Second Trimester of Pregnancy  The second trimester of pregnancy is from week 13 through week 27. This is also called months 4 through 6 of pregnancy. This is often the time when you feel your best. During the second trimester:  Morning sickness is less or has stopped.  You may have more energy.  You may feel hungry more often. At this time, your unborn baby (fetus) is growing very fast. At the end of the sixth month, the unborn baby may be up to 12 inches long and weigh about 1 pounds. You will likely start to feel the baby move between 16 and 20 weeks of pregnancy. Body changes during your second trimester Your body continues to go through many changes during this time. The changes vary and generally return to normal after the baby is born. Physical changes  You will gain more weight.  You may start to get stretch marks on your hips, belly (abdomen), and breasts.  Your breasts will grow and may hurt.  Dark spots or blotches may develop on your face.  A dark line from your belly button to the pubic area (linea nigra) may appear.  You may have changes in your hair. Health changes  You may have headaches.  You may have heartburn.  You may have trouble pooping (constipation).  You may have hemorrhoids or swollen, bulging veins (varicose veins).  Your gums may  bleed.  You may pee (urinate) more often.  You may have back pain. Follow these instructions at home: Medicines  Take over-the-counter and prescription medicines only as told by your doctor. Some medicines are not safe during pregnancy.  Take a prenatal vitamin that contains at least 600 micrograms (mcg) of folic acid. Eating and drinking  Eat healthy meals that include: ? Fresh fruits and vegetables. ? Whole grains. ? Good sources of protein, such as meat, eggs, or tofu. ? Low-fat dairy products.  Avoid raw meat and unpasteurized juice, milk, and cheese.  You may need to take these actions to prevent or treat trouble pooping: ? Drink enough fluids to keep your pee (urine) pale yellow. ? Eat foods that are high in fiber. These include beans, whole grains, and fresh fruits and vegetables. ? Limit foods that are high in fat and sugar. These include fried or sweet foods. Activity  Exercise only as told by your doctor. Most people can do their usual exercise during pregnancy. Try to exercise for 30 minutes at least 5 days a week.  Stop exercising if you have pain or cramps in your belly or lower back.  Do not exercise if it is too hot or too humid, or if you are in a place of great height (high altitude).  Avoid heavy lifting.  If you choose to, you may have sex unless your doctor tells you not to. Relieving pain and discomfort  Wear a good support bra if your breasts   are sore.  Take warm water baths (sitz baths) to soothe pain or discomfort caused by hemorrhoids. Use hemorrhoid cream if your doctor approves.  Rest with your legs raised (elevated) if you have leg cramps or low back pain.  If you develop bulging veins in your legs: ? Wear support hose as told by your doctor. ? Raise your feet for 15 minutes, 3-4 times a day. ? Limit salt in your food. Safety  Wear your seat belt at all times when you are in a car.  Talk with your doctor if someone is hurting you or  yelling at you a lot. Lifestyle  Do not use hot tubs, steam rooms, or saunas.  Do not douche. Do not use tampons or scented sanitary pads.  Avoid cat litter boxes and soil used by cats. These carry germs that can harm your baby and can cause a loss of your baby by miscarriage or stillbirth.  Do not use herbal medicines, illegal drugs, or medicines that are not approved by your doctor. Do not drink alcohol.  Do not smoke or use any products that contain nicotine or tobacco. If you need help quitting, ask your doctor. General instructions  Keep all follow-up visits. This is important.  Ask your doctor about local prenatal classes.  Ask your doctor about the right foods to eat or for help finding a counselor. Where to find more information  American Pregnancy Association: americanpregnancy.org  American College of Obstetricians and Gynecologists: www.acog.org  Office on Women's Health: womenshealth.gov/pregnancy Contact a doctor if:  You have a headache that does not go away when you take medicine.  You have changes in how you see, or you see spots in front of your eyes.  You have mild cramps, pressure, or pain in your lower belly.  You continue to feel like you may vomit (nauseous), you vomit, or you have watery poop (diarrhea).  You have bad-smelling fluid coming from your vagina.  You have pain when you pee or your pee smells bad.  You have very bad swelling of your face, hands, ankles, feet, or legs.  You have a fever. Get help right away if:  You are leaking fluid from your vagina.  You have spotting or bleeding from your vagina.  You have very bad belly cramping or pain.  You have trouble breathing.  You have chest pain.  You faint.  You have not felt your baby move for the time period told by your doctor.  You have new or increased pain, swelling, or redness in an arm or leg. Summary  The second trimester of pregnancy is from week 13 through week 27  (months 4 through 6).  Eat healthy meals.  Exercise as told by your doctor. Most people can do their usual exercise during pregnancy.  Do not use herbal medicines, illegal drugs, or medicines that are not approved by your doctor. Do not drink alcohol.  Call your doctor if you get sick or if you notice anything unusual about your pregnancy. This information is not intended to replace advice given to you by your health care provider. Make sure you discuss any questions you have with your health care provider. Document Revised: 02/06/2020 Document Reviewed: 12/13/2019 Elsevier Patient Education  2021 [employer] Common Medications Safe in Pregnancy  Acne:      Constipation:  Benzoyl Peroxide     Colace  Clindamycin      Dulcolax Suppository  Topica Erythromycin     Fibercon  Salicylic Acid        Metamucil         Miralax AVOID:        Senakot   Accutane    Cough:  Retin-A       Cough Drops  Tetracycline      Phenergan w/ Codeine if Rx  Minocycline      Robitussin (Plain & DM)  Antibiotics:     Crabs/Lice:  Ceclor       RID  Cephalosporins    AVOID:  E-Mycins      Kwell  Keflex  Macrobid/Macrodantin   Diarrhea:  Penicillin      Kao-Pectate  Zithromax      Imodium AD         PUSH FLUIDS AVOID:       Cipro     Fever:  Tetracycline      Tylenol (Regular or Extra  Minocycline       Strength)  Levaquin      Extra Strength-Do not          Exceed 8 tabs/24 hrs Caffeine:        <200mg/day (equiv. To 1 cup of coffee or  approx. 3 12 oz sodas)         Gas: Cold/Hayfever:       Gas-X  Benadryl      Mylicon  Claritin       Phazyme  **Claritin-D        Chlor-Trimeton    Headaches:  Dimetapp      ASA-Free Excedrin  Drixoral-Non-Drowsy     Cold Compress  Mucinex (Guaifenasin)     Tylenol (Regular or Extra  Sudafed/Sudafed-12 Hour     Strength)  **Sudafed PE Pseudoephedrine   Tylenol Cold & Sinus     Vicks Vapor Rub  Zyrtec  **AVOID if Problems With Blood  Pressure         Heartburn: Avoid lying down for at least 1 hour after meals  Aciphex      Maalox     Rash:  Milk of Magnesia     Benadryl    Mylanta       1% Hydrocortisone Cream  Pepcid  Pepcid Complete   Sleep Aids:  Prevacid      Ambien   Prilosec       Benadryl  Rolaids       Chamomile Tea  Tums (Limit 4/day)     Unisom         Tylenol PM         Warm milk-add vanilla or  Hemorrhoids:       Sugar for taste  Anusol/Anusol H.C.  (RX: Analapram 2.5%)  Sugar Substitutes:  Hydrocortisone OTC     Ok in moderation  Preparation H      Tucks        Vaseline lotion applied to tissue with wiping    Herpes:     Throat:  Acyclovir      Oragel  Famvir  Valtrex     Vaccines:         Flu Shot Leg Cramps:       *Gardasil  Benadryl      Hepatitis A         Hepatitis B Nasal Spray:       Pneumovax  Saline Nasal Spray     Polio Booster         Tetanus Nausea:       Tuberculosis test or PPD  Vitamin B6 25 mg TID   AVOID:      Dramamine      *Gardasil  Emetrol       Live Poliovirus  Ginger Root 250 mg QID    MMR (measles, mumps &  High Complex Carbs @ Bedtime    rebella)  Sea Bands-Accupressure    Varicella (Chickenpox)  Unisom 1/2 tab TID     *No known complications           If received before Pain:         Known pregnancy;   Darvocet       Resume series after  Lortab        Delivery  Percocet    Yeast:   Tramadol      Femstat  Tylenol 3      Gyne-lotrimin  Ultram       Monistat  Vicodin           MISC:         All Sunscreens           Hair Coloring/highlights          Insect Repellant's          (Including DEET)         Mystic Tans

## 2020-12-03 ENCOUNTER — Other Ambulatory Visit: Payer: Medicaid Other

## 2020-12-18 ENCOUNTER — Encounter: Payer: Self-pay | Admitting: Obstetrics and Gynecology

## 2020-12-18 ENCOUNTER — Other Ambulatory Visit: Payer: Self-pay

## 2020-12-18 ENCOUNTER — Ambulatory Visit
Admission: RE | Admit: 2020-12-18 | Discharge: 2020-12-18 | Disposition: A | Discharge status: Home / Self Care | Payer: 59 | Source: Ambulatory Visit | Attending: Obstetrics and Gynecology | Admitting: Obstetrics and Gynecology

## 2020-12-18 DIAGNOSIS — Z3402 Encounter for supervision of normal first pregnancy, second trimester: Secondary | ICD-10-CM | POA: Diagnosis not present

## 2020-12-20 NOTE — Addendum Note (Signed)
Addended by: Fabian November on: 12/20/2020 11:46 AM   Modules accepted: Orders

## 2020-12-24 ENCOUNTER — Encounter: Payer: Self-pay | Admitting: Obstetrics and Gynecology

## 2020-12-24 ENCOUNTER — Other Ambulatory Visit: Payer: Self-pay

## 2020-12-24 ENCOUNTER — Ambulatory Visit (INDEPENDENT_AMBULATORY_CARE_PROVIDER_SITE_OTHER): Payer: 59 | Admitting: Obstetrics and Gynecology

## 2020-12-24 VITALS — BP 136/82 | HR 93 | Wt 155.9 lb

## 2020-12-24 DIAGNOSIS — Z3A19 19 weeks gestation of pregnancy: Secondary | ICD-10-CM

## 2020-12-24 DIAGNOSIS — Z3402 Encounter for supervision of normal first pregnancy, second trimester: Secondary | ICD-10-CM

## 2020-12-24 LAB — POCT URINALYSIS DIPSTICK OB
Bilirubin, UA: NEGATIVE
Blood, UA: NEGATIVE
Glucose, UA: NEGATIVE
Ketones, UA: NEGATIVE
Leukocytes, UA: NEGATIVE
Nitrite, UA: NEGATIVE
POC,PROTEIN,UA: NEGATIVE
Spec Grav, UA: 1.01 (ref 1.010–1.025)
Urobilinogen, UA: 0.2 E.U./dL
pH, UA: 8 (ref 5.0–8.0)

## 2020-12-24 NOTE — Progress Notes (Signed)
ROB: Patient doing well.  Now feels fetal movement.  Some back pain-discussed.  Follow-up anatomy ultrasound scheduled.  [facility]ME

## 2020-12-29 ENCOUNTER — Other Ambulatory Visit: Payer: 59

## 2020-12-29 ENCOUNTER — Other Ambulatory Visit: Payer: Self-pay

## 2020-12-31 LAB — AFP, SERUM, OPEN SPINA BIFIDA
AFP MoM: 0.79
AFP Value: 49.9 ng/mL
Gest. Age on Collection Date: 20.6 weeks
Maternal Age At EDD: 25.6 yr
OSBR Risk 1 IN: 10000
Test Results:: NEGATIVE
Weight: 155 [lb_av]

## 2021-01-15 ENCOUNTER — Other Ambulatory Visit: Payer: Self-pay

## 2021-01-15 ENCOUNTER — Ambulatory Visit
Admission: RE | Admit: 2021-01-15 | Discharge: 2021-01-15 | Disposition: A | Discharge status: Home / Self Care | Payer: 59 | Source: Ambulatory Visit | Attending: Obstetrics and Gynecology | Admitting: Obstetrics and Gynecology

## 2021-01-15 DIAGNOSIS — Z362 Encounter for other antenatal screening follow-up: Secondary | ICD-10-CM | POA: Insufficient documentation

## 2021-01-21 ENCOUNTER — Other Ambulatory Visit: Payer: Self-pay

## 2021-01-21 ENCOUNTER — Ambulatory Visit (INDEPENDENT_AMBULATORY_CARE_PROVIDER_SITE_OTHER): Payer: 59 | Admitting: Obstetrics and Gynecology

## 2021-01-21 ENCOUNTER — Encounter: Payer: Self-pay | Admitting: Obstetrics and Gynecology

## 2021-01-21 VITALS — BP 126/84 | HR 93 | Wt 161.9 lb

## 2021-01-21 DIAGNOSIS — R030 Elevated blood-pressure reading, without diagnosis of hypertension: Secondary | ICD-10-CM

## 2021-01-21 DIAGNOSIS — Z3A23 23 weeks gestation of pregnancy: Secondary | ICD-10-CM

## 2021-01-21 DIAGNOSIS — Z3402 Encounter for supervision of normal first pregnancy, second trimester: Secondary | ICD-10-CM

## 2021-01-21 LAB — POCT URINALYSIS DIPSTICK OB
Bilirubin, UA: NEGATIVE
Blood, UA: NEGATIVE
Glucose, UA: NEGATIVE
Ketones, UA: NEGATIVE
Leukocytes, UA: NEGATIVE
Nitrite, UA: NEGATIVE
POC,PROTEIN,UA: NEGATIVE
Spec Grav, UA: <=1.005 — AB (ref 1.010–1.025)
Urobilinogen, UA: 0.2 E.U./dL
pH, UA: 7 (ref 5.0–8.0)

## 2021-01-21 NOTE — Progress Notes (Signed)
OB-Pt present for routine prenatal care. Pt stated that she has been checking her BP at home and noticed that it has been elevated. Pt's bp was elevated today, pt denies any swelling in hands, feet or face. Pt stated that she has been drinking a lot of water to help with the bp levels.

## 2021-01-21 NOTE — Progress Notes (Signed)
ROB: Paitent complains of heartburn, is taking generic Tums, which helps sometimes. Advised on Zantac 360 and Prilosec OTC. Discussed breastfeeding. Patient concerned about BPs, takes them at home sometimes when she feels "off", notes that they are elevated but usually after resting and drinking water it comes down. Initial BP elevated today 155/78, but repeat 126/84.  Will continue to monitor. Normal anatomy scan. RTC in 4 weeks, for 28 week labs at that time.    The following were addressed during this visit:  Breastfeeding Education - The importance of exclusive breastfeeding    Comments: Provides antibodies, Lower risk of breast and ovarian cancers, and type-2 diabetes,Helps your body recover, Reduced chance of SIDS.   - Feeding on demand or baby-led feeding    Comments: Helps prevent breastfeeding complications, helps bring in good milk supply, prevents under or overfeeding, and helps baby feel content and satisfied   - Frequent feeding to help assure optimal milk production    Comments: Making a full supply of milk requires frequent removal of milk from breasts, infant will eat 8-12 times in 24 hours, if separated from infant use breast massage, hand expression and/ or pumping to remove milk from breasts.   - Effective positioning and attachment    Comments: Helps my baby to get enough breast milk, helps to produce an adequate milk supply, and helps prevent nipple pain and damage   - Exclusive breastfeeding for the first 6 months    Comments: Builds a healthy milk supply and keeps it up, protects baby from sickness and disease, and breastmilk has everything your baby needs for the first 6 months.

## 2021-01-21 NOTE — Patient Instructions (Signed)
1-Hour Glucose  No dessert the night before No sweet drinks the day of- soda, fruit juice, sweet tea No sweet breakfast- pancakes, donuts May have mostly protein- egg, bacon, wheat toast, black coffee.               Grilled chicken, salad, vegetable, water.       3-Hour Glucose Test  Must be fasting.  Nothing to eat or drink after midnight.  May have morning medication with a sip of water.      Second Trimester of Pregnancy  The second trimester of pregnancy is from week 13 through week 27. This is also called months 4 through 6 of pregnancy. This is often the time when you feel your best. During the second trimester:  Morning sickness is less or has stopped.  You may have more energy.  You may feel hungry more often. At this time, your unborn baby (fetus) is growing very fast. At the end of the sixth month, the unborn baby may be up to 12 inches long and weigh about 1 pounds. You will likely start to feel the baby move between 16 and 20 weeks of pregnancy. Body changes during your second trimester Your body continues to go through many changes during this time. The changes vary and generally return to normal after the baby is born. Physical changes  You will gain more weight.  You may start to get stretch marks on your hips, belly (abdomen), and breasts.  Your breasts will grow and may hurt.  Dark spots or blotches may develop on your face.  A dark line from your belly button to the pubic area (linea nigra) may appear.  You may have changes in your hair. Health changes  You may have headaches.  You may have heartburn.  You may have trouble pooping (constipation).  You may have hemorrhoids or swollen, bulging veins (varicose veins).  Your gums may bleed.  You may pee (urinate) more often.  You may have back pain. Follow these instructions at home: Medicines  Take over-the-counter and prescription medicines only as told by your doctor. Some  medicines are not safe during pregnancy.  Take a prenatal vitamin that contains at least 600 micrograms (mcg) of folic acid. Eating and drinking  Eat healthy meals that include: ? Fresh fruits and vegetables. ? Whole grains. ? Good sources of protein, such as meat, eggs, or tofu. ? Low-fat dairy products.  Avoid raw meat and unpasteurized juice, milk, and cheese.  You may need to take these actions to prevent or treat trouble pooping: ? Drink enough fluids to keep your pee (urine) pale yellow. ? Eat foods that are high in fiber. These include beans, whole grains, and fresh fruits and vegetables. ? Limit foods that are high in fat and sugar. These include fried or sweet foods. Activity  Exercise only as told by your doctor. Most people can do their usual exercise during pregnancy. Try to exercise for 30 minutes at least 5 days a week.  Stop exercising if you have pain or cramps in your belly or lower back.  Do not exercise if it is too hot or too humid, or if you are in a place of great height (high altitude).  Avoid heavy lifting.  If you choose to, you may have sex unless your doctor tells you not to. Relieving pain and discomfort  Wear a good support bra if your breasts are sore.  Take warm water baths (sitz baths) to soothe pain  or discomfort caused by hemorrhoids. Use hemorrhoid cream if your doctor approves.  Rest with your legs raised (elevated) if you have leg cramps or low back pain.  If you develop bulging veins in your legs: ? Wear support hose as told by your doctor. ? Raise your feet for 15 minutes, 3-4 times a day. ? Limit salt in your food. Safety  Wear your seat belt at all times when you are in a car.  Talk with your doctor if someone is hurting you or yelling at you a lot. Lifestyle  Do not use hot tubs, steam rooms, or saunas.  Do not douche. Do not use tampons or scented sanitary pads.  Avoid cat litter boxes and soil used by cats. These carry  germs that can harm your baby and can cause a loss of your baby by miscarriage or stillbirth.  Do not use herbal medicines, illegal drugs, or medicines that are not approved by your doctor. Do not drink alcohol.  Do not smoke or use any products that contain nicotine or tobacco. If you need help quitting, ask your doctor. General instructions  Keep all follow-up visits. This is important.  Ask your doctor about local prenatal classes.  Ask your doctor about the right foods to eat or for help finding a counselor. Where to find more information  American Pregnancy Association: americanpregnancy.org  [employer]: www.acog.org  Office on [employer]: KeywordPortfolios.com.br Contact a doctor if:  You have a headache that does not go away when you take medicine.  You have changes in how you see, or you see spots in front of your eyes.  You have mild cramps, pressure, or pain in your lower belly.  You continue to feel like you may vomit (nauseous), you vomit, or you have watery poop (diarrhea).  You have bad-smelling fluid coming from your vagina.  You have pain when you pee or your pee smells bad.  You have very bad swelling of your face, hands, ankles, feet, or legs.  You have a fever. Get help right away if:  You are leaking fluid from your vagina.  You have spotting or bleeding from your vagina.  You have very bad belly cramping or pain.  You have trouble breathing.  You have chest pain.  You faint.  You have not felt your baby move for the time period told by your doctor.  You have new or increased pain, swelling, or redness in an arm or leg. Summary  The second trimester of pregnancy is from week 13 through week 27 (months 4 through 6).  Eat healthy meals.  Exercise as told by your doctor. Most people can do their usual exercise during pregnancy.  Do not use herbal medicines, illegal drugs, or medicines that are  not approved by your doctor. Do not drink alcohol.  Call your doctor if you get sick or if you notice anything unusual about your pregnancy. This information is not intended to replace advice given to you by your health care provider. Make sure you discuss any questions you have with your health care provider. Document Revised: 02/06/2020 Document Reviewed: 12/13/2019 Elsevier Patient Education  [place]. Common Medications Safe in Pregnancy  Acne:      Constipation:  Benzoyl Peroxide     Colace  Clindamycin      Dulcolax Suppository  Topica Erythromycin     Fibercon  Salicylic Acid      Metamucil  Miralax AVOID:        Senakot   Accutane    Cough:  Retin-A       Cough Drops  Tetracycline      Phenergan w/ Codeine if Rx  Minocycline      Robitussin (Plain & DM)  Antibiotics:     Crabs/Lice:  Ceclor       RID  Cephalosporins    AVOID:  E-Mycins      Kwell  Keflex  Macrobid/Macrodantin   Diarrhea:  Penicillin      Kao-Pectate  Zithromax      Imodium AD         PUSH FLUIDS AVOID:       Cipro     Fever:  Tetracycline      Tylenol (Regular or Extra  Minocycline       Strength)  Levaquin      Extra Strength-Do not          Exceed 8 tabs/24 hrs Caffeine:        '200mg'$ /day (equiv. To 1 cup of coffee or  approx. 3 12 oz sodas)         Gas: Cold/Hayfever:       Gas-X  Benadryl      Mylicon  Claritin       Phazyme  **Claritin-D        Chlor-Trimeton    Headaches:  Dimetapp      ASA-Free Excedrin  Drixoral-Non-Drowsy     Cold Compress  Mucinex (Guaifenasin)     Tylenol (Regular or Extra  Sudafed/Sudafed-12 Hour     Strength)  **Sudafed PE Pseudoephedrine   Tylenol Cold & Sinus     Vicks Vapor Rub  Zyrtec  **AVOID if Problems With Blood Pressure         Heartburn: Avoid lying down for at least 1 hour after meals  Aciphex      Maalox     Rash:  Milk of Magnesia     Benadryl    Mylanta       1% Hydrocortisone Cream  Pepcid  Pepcid Complete   Sleep  Aids:  Prevacid      Ambien   Prilosec       Benadryl  Rolaids       Chamomile Tea  Tums (Limit 4/day)     Unisom         Tylenol PM         Warm milk-add vanilla or  Hemorrhoids:       Sugar for taste  Anusol/Anusol H.C.  (RX: Analapram 2.5%)  Sugar Substitutes:  Hydrocortisone OTC     Ok in moderation  Preparation H      Tucks        Vaseline lotion applied to tissue with wiping    Herpes:     Throat:  Acyclovir      Oragel  Famvir  Valtrex     Vaccines:         Flu Shot Leg Cramps:       *Gardasil  Benadryl      Hepatitis A         Hepatitis B Nasal Spray:       Pneumovax  Saline Nasal Spray     Polio Booster         Tetanus Nausea:       Tuberculosis test or PPD  Vitamin B6 25 mg TID   AVOID:    Dramamine      *  Gardasil  Emetrol       Live Poliovirus  Ginger Root 250 mg QID    MMR (measles, mumps &  High Complex Carbs @ Bedtime    rebella)  Sea Bands-Accupressure    Varicella (Chickenpox)  Unisom 1/2 tab TID     *No known complications           If received before Pain:         Known pregnancy;   Darvocet       Resume series after  Lortab        Delivery  Percocet    Yeast:   Tramadol      Femstat  Tylenol 3      Gyne-lotrimin  Ultram       Monistat  Vicodin           MISC:         All Sunscreens           Hair Coloring/highlights          Insect Repellant's          (Including DEET)         Mystic Tans  Tdap (Tetanus, Diphtheria, Pertussis) Vaccine: What You Need to Know 1. Why get vaccinated? Tdap vaccine can prevent tetanus, diphtheria, and pertussis. Diphtheria and pertussis spread from person to person. Tetanus enters the body through cuts or wounds.  TETANUS (T) causes painful stiffening of the muscles. Tetanus can lead to serious health problems, including being unable to open the mouth, having trouble swallowing and breathing, or death.  DIPHTHERIA (D) can lead to difficulty breathing, heart failure, paralysis, or death.  PERTUSSIS (aP), also  known as "whooping cough," can cause uncontrollable, violent coughing that makes it hard to breathe, eat, or drink. Pertussis can be extremely serious especially in babies and young children, causing pneumonia, convulsions, brain damage, or death. In teens and adults, it can cause weight loss, loss of bladder control, passing out, and rib fractures from severe coughing. 2. Tdap vaccine Tdap is only for children 7 years and older, adolescents, and adults.  Adolescents should receive a single dose of Tdap, preferably at age 38 or 16 years. Pregnant people should get a dose of Tdap during every pregnancy, preferably during the early part of the third trimester, to help protect the newborn from pertussis. Infants are most at risk for severe, life-threatening complications from pertussis. Adults who have never received Tdap should get a dose of Tdap. Also, adults should receive a booster dose of either Tdap or Td (a different vaccine that protects against tetanus and diphtheria but not pertussis) every 10 years, or after 5 years in the case of a severe or dirty wound or burn. Tdap may be given at the same time as other vaccines. 3. Talk with your health care provider Tell your vaccine provider if the person getting the vaccine:  Has had an allergic reaction after a previous dose of any vaccine that protects against tetanus, diphtheria, or pertussis, or has any severe, life-threatening allergies  Has had a coma, decreased level of consciousness, or prolonged seizures within 7 days after a previous dose of any pertussis vaccine (DTP, DTaP, or Tdap)  Has seizures or another nervous system problem  Has ever had Guillain-Barr Syndrome (also called "GBS")  Has had severe pain or swelling after a previous dose of any vaccine that protects against tetanus or diphtheria In some cases, your health care provider may decide to postpone Tdap vaccination until a  future visit. People with minor illnesses, such as  a cold, may be vaccinated. People who are moderately or severely ill should usually wait until they recover before getting Tdap vaccine.  Your health care provider can give you more information. 4. Risks of a vaccine reaction  Pain, redness, or swelling where the shot was given, mild fever, headache, feeling tired, and nausea, vomiting, diarrhea, or stomachache sometimes happen after Tdap vaccination. People sometimes faint after medical procedures, including vaccination. Tell your provider if you feel dizzy or have vision changes or ringing in the ears.  As with any medicine, there is a very remote chance of a vaccine causing a severe allergic reaction, other serious injury, or death. 5. What if there is a serious problem? An allergic reaction could occur after the vaccinated person leaves the clinic. If you see signs of a severe allergic reaction (hives, swelling of the face and throat, difficulty breathing, a fast heartbeat, dizziness, or weakness), call 9-1-1 and get the person to the nearest hospital. For other signs that concern you, call your health care provider.  Adverse reactions should be reported to the Vaccine Adverse Event Reporting System (VAERS). Your health care provider will usually file this report, or you can do it yourself. Visit the VAERS website at www.vaers.SamedayNews.es or call (509)464-2133. VAERS is only for reporting reactions, and VAERS staff members do not give medical advice. 6. The National Vaccine Injury Compensation Program The [employer] Vaccine Injury Compensation Program (VICP) is a federal program that was created to compensate people who may have been injured by certain vaccines. Claims regarding alleged injury or death due to vaccination have a time limit for filing, which may be as short as two years. Visit the VICP website at GoldCloset.com.ee or call 518-760-7178 to learn about the program and about filing a claim. 7. How can I learn more?  Ask your  health care provider.  Call your local or state health department.  Visit the website of the Food and Drug Administration (FDA) for vaccine package inserts and additional information at TraderRating.uy.  Contact the Centers for Disease Control and Prevention (CDC): ? Call (830) 248-9346 (1-800-CDC-INFO) or ? Visit CDC's website at http://hunter.com/. Vaccine Information Statement Tdap (Tetanus, Diphtheria, Pertussis) Vaccine (04/18/2020) This information is not intended to replace advice given to you by your health care provider. Make sure you discuss any questions you have with your health care provider. Document Revised: 05/14/2020 Document Reviewed: 05/14/2020 Elsevier Patient Education  2021 [employer].

## 2021-02-18 ENCOUNTER — Other Ambulatory Visit: Payer: 59

## 2021-02-18 ENCOUNTER — Other Ambulatory Visit: Payer: Self-pay | Admitting: Obstetrics and Gynecology

## 2021-02-18 ENCOUNTER — Encounter: Payer: Self-pay | Admitting: Obstetrics and Gynecology

## 2021-02-18 ENCOUNTER — Other Ambulatory Visit: Payer: Self-pay

## 2021-02-18 ENCOUNTER — Ambulatory Visit (INDEPENDENT_AMBULATORY_CARE_PROVIDER_SITE_OTHER): Payer: 59 | Admitting: Obstetrics and Gynecology

## 2021-02-18 VITALS — BP 124/86 | HR 99

## 2021-02-18 DIAGNOSIS — Z23 Encounter for immunization: Secondary | ICD-10-CM

## 2021-02-18 DIAGNOSIS — Z3402 Encounter for supervision of normal first pregnancy, second trimester: Secondary | ICD-10-CM

## 2021-02-18 DIAGNOSIS — Z3A23 23 weeks gestation of pregnancy: Secondary | ICD-10-CM

## 2021-02-18 LAB — POCT URINALYSIS DIPSTICK OB
Bilirubin, UA: NEGATIVE
Blood, UA: NEGATIVE
Glucose, UA: NEGATIVE
Ketones, UA: NEGATIVE
Nitrite, UA: NEGATIVE
Odor: NEGATIVE
POC,PROTEIN,UA: NEGATIVE
Spec Grav, UA: <=1.005 — AB (ref 1.010–1.025)
Urobilinogen, UA: 0.2 E.U./dL
pH, UA: 7.5 (ref 5.0–8.0)

## 2021-02-18 NOTE — Progress Notes (Signed)
ROB: Occasionally has round ligament pain and heartburn for which she takes Zantac.  We have discussed these.  Doing 1 hour GCT today.  Reports very active baby.

## 2021-02-18 NOTE — Progress Notes (Signed)
ROB- tdap, gtt, and BTC signed. Pt c/o of round ligament pain.

## 2021-02-19 LAB — GLUCOSE, 1 HOUR GESTATIONAL: Gestational Diabetes Screen: 98 mg/dL (ref 65–139)

## 2021-02-19 LAB — CBC
Hematocrit: 38.4 % (ref 34.0–46.6)
Hemoglobin: 13.1 g/dL (ref 11.1–15.9)
MCH: 31.9 pg (ref 26.6–33.0)
MCHC: 34.1 g/dL (ref 31.5–35.7)
MCV: 93 fL (ref 79–97)
Platelets: 240 10*3/uL (ref 150–450)
RBC: 4.11 x10E6/uL (ref 3.77–5.28)
RDW: 12.4 % (ref 11.7–15.4)
WBC: 11.5 10*3/uL — ABNORMAL HIGH (ref 3.4–10.8)

## 2021-02-19 LAB — RPR: RPR Ser Ql: NONREACTIVE

## 2021-03-10 NOTE — Progress Notes (Signed)
OB-Pt present for routine prenatal care. Pt stated that she is having to take the Zantac daily and was wondering if that was okay.

## 2021-03-10 NOTE — Patient Instructions (Addendum)
Common Medications Safe in Pregnancy  Acne:      Constipation:  Benzoyl Peroxide     Colace  Clindamycin      Dulcolax Suppository  Topica Erythromycin     Fibercon  Salicylic Acid      Metamucil         Miralax AVOID:        Senakot   Accutane    Cough:  Retin-A       Cough Drops  Tetracycline      Phenergan w/ Codeine if Rx  Minocycline      Robitussin (Plain & DM)  Antibiotics:     Crabs/Lice:  Ceclor       RID  Cephalosporins    AVOID:  E-Mycins      Kwell  Keflex  Macrobid/Macrodantin   Diarrhea:  Penicillin      Kao-Pectate  Zithromax      Imodium AD         PUSH FLUIDS AVOID:       Cipro     Fever:  Tetracycline      Tylenol (Regular or Extra  Minocycline       Strength)  Levaquin      Extra Strength-Do not          Exceed 8 tabs/24 hrs Caffeine:        <200mg/day (equiv. To 1 cup of coffee or  approx. 3 12 oz sodas)         Gas: Cold/Hayfever:       Gas-X  Benadryl      Mylicon  Claritin       Phazyme  **Claritin-D        Chlor-Trimeton    Headaches:  Dimetapp      ASA-Free Excedrin  Drixoral-Non-Drowsy     Cold Compress  Mucinex (Guaifenasin)     Tylenol (Regular or Extra  Sudafed/Sudafed-12 Hour     Strength)  **Sudafed PE Pseudoephedrine   Tylenol Cold & Sinus     Vicks Vapor Rub  Zyrtec  **AVOID if Problems With Blood Pressure         Heartburn: Avoid lying down for at least 1 hour after meals  Aciphex      Maalox     Rash:  Milk of Magnesia     Benadryl    Mylanta       1% Hydrocortisone Cream  Pepcid  Pepcid Complete   Sleep Aids:  Prevacid      Ambien   Prilosec       Benadryl  Rolaids       Chamomile Tea  Tums (Limit 4/day)     Unisom         Tylenol PM         Warm milk-add vanilla or  Hemorrhoids:       Sugar for taste  Anusol/Anusol H.C.  (RX: Analapram 2.5%)  Sugar Substitutes:  Hydrocortisone OTC     Ok in moderation  Preparation H      Tucks        Vaseline lotion applied to tissue with  wiping    Herpes:     Throat:  Acyclovir      Oragel  Famvir  Valtrex     Vaccines:         Flu Shot Leg Cramps:       *Gardasil  Benadryl      Hepatitis A         Hepatitis B Nasal Spray:         Pneumovax  Saline Nasal Spray     Polio Booster         Tetanus Nausea:       Tuberculosis test or PPD  Vitamin B6 25 mg TID   AVOID:    Dramamine      *Gardasil  Emetrol       Live Poliovirus  Ginger Root 250 mg QID    MMR (measles, mumps &  High Complex Carbs @ Bedtime    rebella)  Sea Bands-Accupressure    Varicella (Chickenpox)  Unisom 1/2 tab TID     *No known complications           If received before Pain:         Known pregnancy;   Darvocet       Resume series after  Lortab        Delivery  Percocet    Yeast:   Tramadol      Femstat  Tylenol 3      Gyne-lotrimin  Ultram       Monistat  Vicodin           MISC:         All Sunscreens           Hair Coloring/highlights          Insect Repellant's          (Including DEET)         Mystic Tans Breastfeeding and Breast Care It is normal to have some problems when you start to breastfeed your new baby. But there are things that you can do to take care of yourself and help prevent problems. This includes keeping your breasts healthy and making sure that your baby's mouth attaches (latches) properly to your nipple for feedings. Work with your doctor or breastfeeding specialist to find what works best foryou. How does self-care benefit me? If you keep your breasts healthy and you let your baby attach to your nipples in the right way, you will avoid these problems: Cracked or sore nipples. Breasts becoming overfilled with milk. Plugged milk ducts. Low milk supply. Breast swelling or infection. How does self-care benefit my baby? By preventing problems with your breasts, you will ensure that your baby willfeed well and will gain the right amount of weight. What actions can I take to care for myself during breastfeeding? Best ways  to breastfeed Always make sure that your baby latches properly to breastfeed. Make sure that your baby is in a proper position. Try different breastfeeding positions to find one that works best for you and your baby. Breastfeed when you feel like you need to make your breasts less full or when your baby shows signs of hunger. This is called "breastfeeding on demand." Do not delay feedings. Try to relax when it is time to feed your baby. This helps your body release milk from your breast. To help increase milk flow, do these things before feeding: Remove a small amount of milk from your breast. Use a pump or squeeze with your hand. Apply warm, moist heat to your breast. Do this in the shower or use hand towels soaked with warm water. Massage your breasts. Do this when you are breastfeeding as well. Caring for your breasts     To help your breasts stay healthy and keep them from getting too dry: Avoid using soap on your nipples. Let your nipples air-dry for 3-4 minutes after each feeding. Do not use things like a hair dryer  to dry your breasts. This can make the skin dry and will cause irritation and pain. Use only cotton bra pads to soak up breast milk that leaks. Change the pads if they become soaked with milk. If you use bra pads that can be thrown away, change them often. Put some lanolin on your nipples after breastfeeding. Pure lanolin does not need to be washed off your nipple before you feed your baby again. Pure lanolin is not harmful to your baby. Rub some breast milk into your nipples: Use your hand to squeeze out a few drops of breast milk. Gently massage the milk into your nipples. Let your nipples air-dry. Wear a supportive nursing bra. Avoid wearing: Tight clothing. Underwire bras or bras that put pressure on your breasts. Use ice to help relieve pain or swelling of your breasts: Put ice in a plastic bag. Place a towel between your skin and the bag. Leave the ice on for 20  minutes, 2-3 times a day. Follow these instructions at home: Drink enough fluid to keep your pee (urine) pale yellow. Get plenty of rest. Sleep when your baby sleeps. Talk to your doctor or breastfeeding specialist before taking any herbal supplements. Eat a balanced diet. This includes fruits, vegetables, whole grains, lean proteins, and dairy or dairy alternatives Contact a health care provider if: You have nipple pain. You have cracking or soreness in your nipples that lasts longer than 1 week. Your breasts are overfilled with milk, and this lasts longer than 48 hours. You have a fever. You have pus-like fluid coming from your nipple. You have redness, a rash, swelling, itching, or burning on your breast. Your baby does not gain weight. Your baby loses weight. Your baby is not feeding regularly or is very sleepy and lacks energy. Summary There are things that you can do to take care of yourself and help prevent many common breastfeeding problems. Always make sure that your baby's mouth attaches (latches) to your nipple properly to breastfeed. Keep your nipples from getting too dry, drink plenty of fluid, and get plenty of rest. Feed on demand. Do not delay feedings. This information is not intended to replace advice given to you by your health care provider. Make sure you discuss any questions you have with your healthcare provider. Document Revised: 02/19/2020 Document Reviewed: 02/19/2020 Elsevier Patient Education  [place].

## 2021-03-11 ENCOUNTER — Ambulatory Visit (INDEPENDENT_AMBULATORY_CARE_PROVIDER_SITE_OTHER): Payer: 59 | Admitting: Obstetrics and Gynecology

## 2021-03-11 ENCOUNTER — Other Ambulatory Visit: Payer: Self-pay

## 2021-03-11 ENCOUNTER — Encounter: Payer: Self-pay | Admitting: Obstetrics and Gynecology

## 2021-03-11 VITALS — BP 111/81 | HR 96 | Wt 169.5 lb

## 2021-03-11 DIAGNOSIS — O99613 Diseases of the digestive system complicating pregnancy, third trimester: Secondary | ICD-10-CM

## 2021-03-11 DIAGNOSIS — Z3483 Encounter for supervision of other normal pregnancy, third trimester: Secondary | ICD-10-CM

## 2021-03-11 DIAGNOSIS — K219 Gastro-esophageal reflux disease without esophagitis: Secondary | ICD-10-CM

## 2021-03-11 DIAGNOSIS — Z3A30 30 weeks gestation of pregnancy: Secondary | ICD-10-CM

## 2021-03-11 LAB — POCT URINALYSIS DIPSTICK OB
Bilirubin, UA: NEGATIVE
Blood, UA: NEGATIVE
Glucose, UA: NEGATIVE
Ketones, UA: NEGATIVE
Leukocytes, UA: NEGATIVE
Nitrite, UA: NEGATIVE
POC,PROTEIN,UA: NEGATIVE
Spec Grav, UA: <=1.005 — AB (ref 1.010–1.025)
Urobilinogen, UA: 0.2 E.U./dL
pH, UA: 7.5 (ref 5.0–8.0)

## 2021-03-11 NOTE — Progress Notes (Signed)
ROB: Wonders if it is ok for her to take Zantac daily as she is noticing daily heartburn.  Also drinking milk at night. Advised that she could use daily Zantac. Can get prescription medication if it continues to worsen. RTC in 2 weeks.

## 2021-03-20 ENCOUNTER — Other Ambulatory Visit: Payer: Self-pay

## 2021-03-20 MED ORDER — PANTOPRAZOLE SODIUM 20 MG PO TBEC: 20.0000 mg | DELAYED_RELEASE_TABLET | Freq: Every day | ORAL | 2 refills | Status: DC

## 2021-03-20 NOTE — Progress Notes (Signed)
Error

## 2021-03-25 ENCOUNTER — Other Ambulatory Visit: Payer: Self-pay

## 2021-03-25 ENCOUNTER — Encounter: Payer: Self-pay | Admitting: Obstetrics and Gynecology

## 2021-03-25 ENCOUNTER — Ambulatory Visit (INDEPENDENT_AMBULATORY_CARE_PROVIDER_SITE_OTHER): Payer: 59 | Admitting: Obstetrics and Gynecology

## 2021-03-25 VITALS — BP 132/87 | HR 100 | Wt 169.4 lb

## 2021-03-25 DIAGNOSIS — Z3A32 32 weeks gestation of pregnancy: Secondary | ICD-10-CM

## 2021-03-25 DIAGNOSIS — Z3483 Encounter for supervision of other normal pregnancy, third trimester: Secondary | ICD-10-CM

## 2021-03-25 LAB — POCT URINALYSIS DIPSTICK OB
Bilirubin, UA: NEGATIVE
Blood, UA: NEGATIVE
Glucose, UA: NEGATIVE
Leukocytes, UA: NEGATIVE
Nitrite, UA: NEGATIVE
POC,PROTEIN,UA: NEGATIVE
Spec Grav, UA: <=1.005 — AB (ref 1.010–1.025)
Urobilinogen, UA: 0.2 E.U./dL
pH, UA: 7 (ref 5.0–8.0)

## 2021-03-25 NOTE — Progress Notes (Signed)
ROB: Doing well this week.  Denies contractions.  Reports daily fetal movement.  Discussed birth control options for postpartum.  Guest policy during labor discussed.

## 2021-04-07 NOTE — Patient Instructions (Addendum)
WHAT OB PATIENTS CAN EXPECT  Confirmation of pregnancy and ultrasound ordered if medically indicated-[redacted] weeks gestation New OB (NOB) intake with nurse and New OB (NOB) labs- [redacted] weeks gestation New OB (NOB) physical examination with provider- 11/[redacted] weeks gestation Flu vaccine-[redacted] weeks gestation Anatomy scan-[redacted] weeks gestation Glucose tolerance test, blood work to test for anemia, T-dap vaccine-[redacted] weeks gestation Vaginal swabs/cultures-STD/Group B strep-[redacted] weeks gestation Appointments every 4 weeks until 28 weeks Every 2 weeks from 28 weeks until 36 weeks Weekly visits from 36 weeks until delivery  Common Medications Safe in Pregnancy  Acne:      Constipation:  Benzoyl Peroxide     Colace  Clindamycin      Dulcolax Suppository  Topica Erythromycin     Fibercon  Salicylic Acid      Metamucil         Miralax AVOID:        Senakot   Accutane    Cough:  Retin-A       Cough Drops  Tetracycline      Phenergan w/ Codeine if Rx  Minocycline      Robitussin (Plain & DM)  Antibiotics:     Crabs/Lice:  Ceclor       RID  Cephalosporins    AVOID:  E-Mycins      Kwell  Keflex  Macrobid/Macrodantin   Diarrhea:  Penicillin      Kao-Pectate  Zithromax      Imodium AD         PUSH FLUIDS AVOID:       Cipro     Fever:  Tetracycline      Tylenol (Regular or Extra  Minocycline       Strength)  Levaquin      Extra Strength-Do not          Exceed 8 tabs/24 hrs Caffeine:        <200mg/day (equiv. To 1 cup of coffee or  approx. 3 12 oz sodas)         Gas: Cold/Hayfever:       Gas-X  Benadryl      Mylicon  Claritin       Phazyme  **Claritin-D        Chlor-Trimeton    Headaches:  Dimetapp      ASA-Free Excedrin  Drixoral-Non-Drowsy     Cold Compress  Mucinex (Guaifenasin)     Tylenol (Regular or Extra  Sudafed/Sudafed-12 Hour     Strength)  **Sudafed PE Pseudoephedrine   Tylenol Cold & Sinus     Vicks Vapor Rub  Zyrtec  **AVOID if Problems With Blood Pressure         Heartburn:  Avoid lying down for at least 1 hour after meals  Aciphex      Maalox     Rash:  Milk of Magnesia     Benadryl    Mylanta       1% Hydrocortisone Cream  Pepcid  Pepcid Complete   Sleep Aids:  Prevacid      Ambien   Prilosec       Benadryl  Rolaids       Chamomile Tea  Tums (Limit 4/day)     Unisom         Tylenol PM         Warm milk-add vanilla or  Hemorrhoids:       Sugar for taste  Anusol/Anusol H.C.  (RX: Analapram 2.5%)  Sugar Substitutes:  Hydrocortisone OTC       Ok in moderation  Preparation H      Tucks        Vaseline lotion applied to tissue with wiping    Herpes:     Throat:  Acyclovir      Oragel  Famvir  Valtrex     Vaccines:         Flu Shot Leg Cramps:       *Gardasil  Benadryl      Hepatitis A         Hepatitis B Nasal Spray:       Pneumovax  Saline Nasal Spray     Polio Booster         Tetanus Nausea:       Tuberculosis test or PPD  Vitamin B6 25 mg TID   AVOID:    Dramamine      *Gardasil  Emetrol       Live Poliovirus  Ginger Root 250 mg QID    MMR (measles, mumps &  High Complex Carbs @ Bedtime    rebella)  Sea Bands-Accupressure    Varicella (Chickenpox)  Unisom 1/2 tab TID     *No known complications           If received before Pain:         Known pregnancy;   Darvocet       Resume series after  Lortab        Delivery  Percocet    Yeast:   Tramadol      Femstat  Tylenol 3      Gyne-lotrimin  Ultram       Monistat  Vicodin           MISC:         All Sunscreens           Hair Coloring/highlights          Insect Repellant's          (Including DEET)         Mystic Tans  

## 2021-04-08 ENCOUNTER — Encounter: Payer: Self-pay | Admitting: Obstetrics and Gynecology

## 2021-04-08 ENCOUNTER — Other Ambulatory Visit: Payer: Self-pay

## 2021-04-08 ENCOUNTER — Ambulatory Visit (INDEPENDENT_AMBULATORY_CARE_PROVIDER_SITE_OTHER): Payer: Medicaid Other | Admitting: Obstetrics and Gynecology

## 2021-04-08 VITALS — BP 122/86 | HR 114 | Wt 170.1 lb

## 2021-04-08 DIAGNOSIS — O99613 Diseases of the digestive system complicating pregnancy, third trimester: Secondary | ICD-10-CM

## 2021-04-08 DIAGNOSIS — K219 Gastro-esophageal reflux disease without esophagitis: Secondary | ICD-10-CM

## 2021-04-08 DIAGNOSIS — Z3A34 34 weeks gestation of pregnancy: Secondary | ICD-10-CM

## 2021-04-08 DIAGNOSIS — Z3403 Encounter for supervision of normal first pregnancy, third trimester: Secondary | ICD-10-CM

## 2021-04-08 LAB — POCT URINALYSIS DIPSTICK OB
Bilirubin, UA: NEGATIVE
Blood, UA: NEGATIVE
Glucose, UA: NEGATIVE
Ketones, UA: NEGATIVE
Leukocytes, UA: NEGATIVE
Nitrite, UA: NEGATIVE
POC,PROTEIN,UA: NEGATIVE
Spec Grav, UA: <=1.005 — AB (ref 1.010–1.025)
Urobilinogen, UA: 0.2 E.U./dL
pH, UA: 7 (ref 5.0–8.0)

## 2021-04-08 NOTE — Progress Notes (Signed)
ROB: Patient notes some pain with bending. Doing much better with reflux medication. Had 4D ultrasound.  Has questions regarding due date. Questions answered, dated by earliest [place]. RTC in 2 weeks. For 36 week labs at that time.

## 2021-04-08 NOTE — Progress Notes (Signed)
OB-Pt present for routine prenatal care. Pt stated fetal movement present; no contractions present; no vaginal bleeding and no changes in vaginal discharge.     Pt c/o sharp pain in upper abd area when she bends over. Pt to follow up in 2 weeks with [facility] for OB visit.

## 2021-04-22 ENCOUNTER — Encounter: Payer: Self-pay | Admitting: Obstetrics and Gynecology

## 2021-04-22 ENCOUNTER — Ambulatory Visit (INDEPENDENT_AMBULATORY_CARE_PROVIDER_SITE_OTHER): Payer: Self-pay | Admitting: Obstetrics and Gynecology

## 2021-04-22 ENCOUNTER — Other Ambulatory Visit: Payer: Self-pay

## 2021-04-22 VITALS — BP 130/81 | HR 106 | Wt 171.6 lb

## 2021-04-22 DIAGNOSIS — Z3403 Encounter for supervision of normal first pregnancy, third trimester: Secondary | ICD-10-CM

## 2021-04-22 DIAGNOSIS — Z3A36 36 weeks gestation of pregnancy: Secondary | ICD-10-CM

## 2021-04-22 NOTE — Progress Notes (Signed)
ROB: Has some new symptoms that she has never had, mild cramps.  Wants to be checked to see if she is dilated.

## 2021-04-22 NOTE — Progress Notes (Signed)
ROB: Doing well.  Occasional Deberah Pelton.  GC/CT-GBS performed today.

## 2021-04-24 LAB — STREP GP B NAA+RFLX: Strep Gp B NAA+Rflx: NEGATIVE

## 2021-04-26 LAB — GC/CHLAMYDIA PROBE AMP
Chlamydia trachomatis, NAA: NEGATIVE
Neisseria Gonorrhoeae by PCR: NEGATIVE

## 2021-04-29 ENCOUNTER — Encounter: Payer: Self-pay | Admitting: Obstetrics and Gynecology

## 2021-04-29 ENCOUNTER — Other Ambulatory Visit: Payer: Self-pay

## 2021-04-29 ENCOUNTER — Ambulatory Visit (INDEPENDENT_AMBULATORY_CARE_PROVIDER_SITE_OTHER): Payer: Medicaid Other | Admitting: Obstetrics and Gynecology

## 2021-04-29 VITALS — BP 148/86 | HR 102 | Wt 171.7 lb

## 2021-04-29 DIAGNOSIS — R030 Elevated blood-pressure reading, without diagnosis of hypertension: Secondary | ICD-10-CM

## 2021-04-29 DIAGNOSIS — Z3403 Encounter for supervision of normal first pregnancy, third trimester: Secondary | ICD-10-CM

## 2021-04-29 DIAGNOSIS — Z3A37 37 weeks gestation of pregnancy: Secondary | ICD-10-CM

## 2021-04-29 LAB — POCT URINALYSIS DIPSTICK OB
Bilirubin, UA: NEGATIVE
Blood, UA: NEGATIVE
Glucose, UA: NEGATIVE
Ketones, UA: NEGATIVE
Leukocytes, UA: NEGATIVE
Nitrite, UA: NEGATIVE
POC,PROTEIN,UA: NEGATIVE
Spec Grav, UA: 1.01 (ref 1.010–1.025)
Urobilinogen, UA: 0.2 E.U./dL
pH, UA: 7.5 (ref 5.0–8.0)

## 2021-04-29 NOTE — Progress Notes (Signed)
ROB: Does note more pelvic pressure. Also noting that she has been checking her BPs at home over the past week or so, noting ranges of 130s-140s/80s-90.  Denies headaches, vision changes, abdominal pain.  BP today elevated, with no priors noted in office. Will order PIH labs for baseline, and have patient f/u in 2 days for repeat BP check. If elevated, discussed likely diagnosis of GHTN in pregnancy, which may have indications for earlier delivery. Patient notes understanding. Normal 36 week labs. F/u in 1 week for routine OB appt if BP normalizes.

## 2021-04-29 NOTE — Patient Instructions (Signed)
Signs and Symptoms of Labor Labor is the body's natural process of moving the baby and the placenta out of the uterus. The process of labor usually starts when the baby is full-term, between 37 and 40 weeks of pregnancy. Signs and symptoms that you are close to going into labor As your body prepares for labor and the birth of your baby, you may notice the following symptoms in the weeks and days before true labor starts: Passing a small amount of thick, bloody mucus from your vagina. This is called normal bloody show or losing your mucus plug. This may happen more than a week before labor begins, or right before labor begins, as the opening of the cervix starts to widen (dilate). For some women, the entire mucus plug passes at once. For others, pieces of the mucus plug may gradually pass over several days. Your baby moving (dropping) lower in your pelvis to get into position for birth (lightening). When this happens, you may feel more pressure on your bladder and pelvic bone and less pressure on your ribs. This may make it easier to breathe. It may also cause you to need to urinate more often and have problems with bowel movements. Having "practice contractions," also called Braxton Hicks contractions or false labor. These occur at irregular (unevenly spaced) intervals that are more than 10 minutes apart. False labor contractions are common after exercise or sexual activity. They will stop if you change position, rest, or drink fluids. These contractions are usually mild and do not get stronger over time. They may feel like: A backache or back pain. Mild cramps, similar to menstrual cramps. Tightening or pressure in your abdomen. Other early symptoms include: Nausea or loss of appetite. Diarrhea. Having a sudden burst of energy, or feeling very tired. Mood changes. Having trouble sleeping. Signs and symptoms that labor has begun Signs that you are in labor may include: Having contractions that come  at regular (evenly spaced) intervals and increase in intensity. This may feel like more intense tightening or pressure in your abdomen that moves to your back. Contractions may also feel like rhythmic pain in your upper thighs or back that comes and goes at regular intervals. For first-time mothers, this change in intensity of contractions often occurs at a more gradual pace. Women who have given birth before may notice a more rapid progression of contraction changes. Feeling pressure in the vaginal area. Your water breaking (rupture of membranes). This is when the sac of fluid that surrounds your baby breaks. Fluid leaking from your vagina may be clear or blood-tinged. Labor usually starts within 24 hours of your water breaking, but it may take longer to begin. Some women may feel a sudden gush of fluid. Others notice that their underwear repeatedly becomes damp. Follow these instructions at home:  When labor starts, or if your water breaks, call your health care provider or nurse care line. Based on your situation, they will determine when you should go in for an exam. During early labor, you may be able to rest and manage symptoms at home. Some strategies to try at home include: Breathing and relaxation techniques. Taking a warm bath or shower. Listening to music. Using a heating pad on the lower back for pain. If you are directed to use heat: Place a towel between your skin and the heat source. Leave the heat on for 20-30 minutes. Remove the heat if your skin turns bright red. This is especially important if you are unable to   feel pain, heat, or cold. You may have a greater risk of getting burned. Contact a health care provider if: Your labor has started. Your water breaks. Get help right away if: You have painful, regular contractions that are 5 minutes apart or less. Labor starts before you are [redacted] weeks along in your pregnancy. You have a fever. You have bright red blood coming from  your vagina. You do not feel your baby moving. You have a severe headache with or without vision problems. You have severe nausea, vomiting, or diarrhea. You have chest pain or shortness of breath. These symptoms may represent a serious problem that is an emergency. Do not wait to see if the symptoms will go away. Get medical help right away. Call your local emergency services (911 in the [place] Do not drive yourself to the hospital. Summary Labor is your body's natural process of moving your baby and the placenta out of your uterus. The process of labor usually starts when your baby is full-term, between 37 and 40 weeks of pregnancy. When labor starts, or if your water breaks, call your health care provider or nurse care line. Based on your situation, they will determine when you should go in for an exam. This information is not intended to replace advice given to you by your health care provider. Make sure you discuss any questions you have with your health care provider. Document Revised: 06/21/2020 Document Reviewed: 06/21/2020 Elsevier Patient Education  2022 [employer]  

## 2021-04-29 NOTE — Progress Notes (Signed)
  OB-Pt present for routine prenatal care. Pt stated fetal movement present; no contractions present; no vaginal bleeding and no changes in vaginal discharge.     Pt c/o lighting pain in the vaginal area.

## 2021-04-30 ENCOUNTER — Other Ambulatory Visit: Payer: Self-pay

## 2021-04-30 ENCOUNTER — Observation Stay (HOSPITAL_BASED_OUTPATIENT_CLINIC_OR_DEPARTMENT_OTHER)
Admission: EM | Admit: 2021-04-30 | Discharge: 2021-04-30 | Disposition: A | Discharge status: Home / Self Care | Payer: Medicaid Other | Source: Home / Self Care | Admitting: Obstetrics and Gynecology

## 2021-04-30 ENCOUNTER — Encounter: Payer: Self-pay | Admitting: Obstetrics and Gynecology

## 2021-04-30 DIAGNOSIS — O139 Gestational [pregnancy-induced] hypertension without significant proteinuria, unspecified trimester: Secondary | ICD-10-CM | POA: Diagnosis present

## 2021-04-30 DIAGNOSIS — O133 Gestational [pregnancy-induced] hypertension without significant proteinuria, third trimester: Secondary | ICD-10-CM | POA: Insufficient documentation

## 2021-04-30 DIAGNOSIS — Z3A37 37 weeks gestation of pregnancy: Secondary | ICD-10-CM

## 2021-04-30 LAB — URINALYSIS, COMPLETE (UACMP) WITH MICROSCOPIC
Bilirubin Urine: NEGATIVE
Glucose, UA: NEGATIVE mg/dL
Hgb urine dipstick: NEGATIVE
Ketones, ur: 20 mg/dL — AB
Nitrite: NEGATIVE
Protein, ur: NEGATIVE mg/dL
Specific Gravity, Urine: 1.006 (ref 1.005–1.030)
pH: 7 (ref 5.0–8.0)

## 2021-04-30 LAB — CBC
Hematocrit: 34.5 % (ref 34.0–46.6)
Hemoglobin: 11.5 g/dL (ref 11.1–15.9)
MCH: 29.6 pg (ref 26.6–33.0)
MCHC: 33.3 g/dL (ref 31.5–35.7)
MCV: 89 fL (ref 79–97)
Platelets: 253 10*3/uL (ref 150–450)
RBC: 3.89 x10E6/uL (ref 3.77–5.28)
RDW: 12.2 % (ref 11.7–15.4)
WBC: 9.1 10*3/uL (ref 3.4–10.8)

## 2021-04-30 LAB — HEPATIC FUNCTION PANEL
ALT: 15 IU/L (ref 0–32)
AST: 15 IU/L (ref 0–40)
Albumin: 3.9 g/dL (ref 3.9–5.0)
Alkaline Phosphatase: 116 IU/L (ref 44–121)
Bilirubin Total: <0.2 mg/dL (ref 0.0–1.2)
Bilirubin, Direct: <0.1 mg/dL (ref 0.00–0.40)
Total Protein: 6.1 g/dL (ref 6.0–8.5)

## 2021-04-30 LAB — PROTEIN / CREATININE RATIO, URINE
Creatinine, Urine: 67.4 mg/dL
Protein, Ur: 17.3 mg/dL
Protein/Creat Ratio: 257 mg/g creat — ABNORMAL HIGH (ref 0–200)

## 2021-04-30 NOTE — Final Progress Note (Signed)
L&D OB Triage Note  Tasha Foster is a 25 y.o. G1P0 female at [redacted]w[redacted]d, EDD Estimated Date of Delivery: 05/20/21 who presented to triage for complaints of elevated blood pressures at home, 140s-150s/90s-100s.  She was evaluated by the nurses with mildly elevated blood pressures.  Patient denied headaches, vision changes, abdominal pain . Vital signs stable. An NST was performed and has been reviewed by MD.    Physical Exam:  Vitals:   04/30/21 1651 04/30/21 1706 04/30/21 1721 04/30/21 1736  BP: 139/87 136/80 136/78 134/83  Pulse: (!) 107 (!) 104 (!) 103 100  Resp:      Temp:      TempSrc:      Weight: 77.6 kg     Height: 5' (1.524 m)        NST INTERPRETATION: Indications: pregnancy-induced hypertension  Mode: External Baseline Rate (A): 140 bpm Variability: Moderate Accelerations: 15 x 15 Decelerations: None     Contraction Frequency (min): rare  Impression: reactive   Labs:  Results for orders placed or performed during the hospital encounter of 04/30/21  Urinalysis, Complete w Microscopic  Result Value Ref Range   Color, Urine YELLOW (A) YELLOW   APPearance HAZY (A) CLEAR   Specific Gravity, Urine 1.006 1.005 - 1.030   pH 7.0 5.0 - 8.0   Glucose, UA NEGATIVE NEGATIVE mg/dL   Hgb urine dipstick NEGATIVE NEGATIVE   Bilirubin Urine NEGATIVE NEGATIVE   Ketones, ur 20 (A) NEGATIVE mg/dL   Protein, ur NEGATIVE NEGATIVE mg/dL   Nitrite NEGATIVE NEGATIVE   Leukocytes,Ua TRACE (A) NEGATIVE   RBC / HPF 0-5 0 - 5 RBC/hpf   WBC, UA 0-5 0 - 5 WBC/hpf   Bacteria, UA RARE (A) NONE SEEN   Squamous Epithelial / LPF 11-20 0 - 5   Mucus PRESENT       Routine Prenatal on 04/29/2021  Component Date Value Ref Range Status   Color, UA 04/29/2021 yellow   Final   Clarity, UA 04/29/2021 clear   Final   Glucose, UA 04/29/2021 Negative  Negative Final   Bilirubin, UA 04/29/2021 neg   Final   Ketones, UA 04/29/2021 neg   Final   Spec Grav, UA 04/29/2021 1.010  1.010 - 1.025 Final    Blood, UA 04/29/2021 neg   Final   pH, UA 04/29/2021 7.5  5.0 - 8.0 Final   POC,PROTEIN,UA 04/29/2021 Negative  Negative, Trace, Small (1+), Moderate (2+), Large (3+), 4+ Final   Urobilinogen, UA 04/29/2021 0.2  0.2 or 1.0 E.U./dL Final   Nitrite, UA 01/65/5374 neg   Final   Leukocytes, UA 04/29/2021 Negative  Negative Final   Appearance 04/29/2021 yellow;clear   Final   WBC 04/29/2021 9.1  3.4 - 10.8 x10E3/uL Final   RBC 04/29/2021 3.89  3.77 - 5.28 x10E6/uL Final   Hemoglobin 04/29/2021 11.5  11.1 - 15.9 g/dL Final   Hematocrit 82/70/7867 34.5  34.0 - 46.6 % Final   MCV 04/29/2021 89  79 - 97 fL Final   MCH 04/29/2021 29.6  26.6 - 33.0 pg Final   MCHC 04/29/2021 33.3  31.5 - 35.7 g/dL Final   RDW 54/49/2010 12.2  11.7 - 15.4 % Final   Platelets 04/29/2021 253  150 - 450 x10E3/uL Final   Total Protein 04/29/2021 6.1  6.0 - 8.5 g/dL Final   Albumin 03/24/1974 3.9  3.9 - 5.0 g/dL Final   Bilirubin Total 04/29/2021 <0.2  0.0 - 1.2 mg/dL Final   Bilirubin, Direct 04/29/2021 <0.10  0.00 - 0.40 mg/dL Final   Alkaline Phosphatase 04/29/2021 116  44 - 121 IU/L Final   AST 04/29/2021 15  0 - 40 IU/L Final   ALT 04/29/2021 15  0 - 32 IU/L Final   Creatinine, Urine 04/29/2021 67.4  Not Estab. mg/dL Final   Protein, Ur 62/13/0865 17.3  Not Estab. mg/dL Final   Protein/Creat Ratio 04/29/2021 257 (A) 0 - 200 mg/g creat Final     Plan: NST performed was reviewed and was found to be reactive. She was discharged home with bleeding/labor precautions.  Continue routine prenatal care. Follow up with OB/GYN as previously scheduled. Will likely schedule for IOL within the next week.     Hildred Laser, MD [facility]

## 2021-04-30 NOTE — OB Triage Note (Signed)
Patient G1P0 [redacted]w[redacted]d presents to L&D with complaints of elevated BP from home reading. Patient states she was told she had high BP in her OB office visit the other day and so she has been taking them at home. She denies headache, blurry vision, RUQ pain, no swelling and +1 reflexes and absent clonus. Patient denies contractions, leaking of fluid, vaginal bleeding and reports + FM. VSS. Monitors applied and assessing.

## 2021-04-30 NOTE — OB Triage Note (Signed)
RN reviewed lab results with MD and MD gives verbal order for patient to be d/c. RN discussed this with patient and support person and they verbalized understanding. RN gave education about hypertension in pregnancy and all questions answered. Patient discharged home in stable condition with support person.

## 2021-05-01 ENCOUNTER — Encounter: Payer: Self-pay | Admitting: Obstetrics and Gynecology

## 2021-05-01 ENCOUNTER — Other Ambulatory Visit: Payer: Self-pay

## 2021-05-01 ENCOUNTER — Ambulatory Visit (INDEPENDENT_AMBULATORY_CARE_PROVIDER_SITE_OTHER): Payer: Medicaid Other | Admitting: Obstetrics and Gynecology

## 2021-05-01 ENCOUNTER — Inpatient Hospital Stay
Admission: EM | Admit: 2021-05-01 | Discharge: 2021-05-05 | DRG: 787 | Disposition: A | Discharge status: Home / Self Care | Payer: Medicaid Other | Attending: Obstetrics and Gynecology | Admitting: Obstetrics and Gynecology

## 2021-05-01 VITALS — BP 156/98 | HR 116 | Ht 60.0 in | Wt 172.0 lb

## 2021-05-01 DIAGNOSIS — O133 Gestational [pregnancy-induced] hypertension without significant proteinuria, third trimester: Secondary | ICD-10-CM | POA: Diagnosis not present

## 2021-05-01 DIAGNOSIS — R000 Tachycardia, unspecified: Secondary | ICD-10-CM | POA: Diagnosis not present

## 2021-05-01 DIAGNOSIS — O9081 Anemia of the puerperium: Secondary | ICD-10-CM | POA: Diagnosis not present

## 2021-05-01 DIAGNOSIS — O99893 Other specified diseases and conditions complicating puerperium: Secondary | ICD-10-CM | POA: Diagnosis not present

## 2021-05-01 DIAGNOSIS — O139 Gestational [pregnancy-induced] hypertension without significant proteinuria, unspecified trimester: Secondary | ICD-10-CM | POA: Diagnosis present

## 2021-05-01 DIAGNOSIS — R030 Elevated blood-pressure reading, without diagnosis of hypertension: Secondary | ICD-10-CM

## 2021-05-01 DIAGNOSIS — D62 Acute posthemorrhagic anemia: Secondary | ICD-10-CM | POA: Diagnosis not present

## 2021-05-01 DIAGNOSIS — K219 Gastro-esophageal reflux disease without esophagitis: Secondary | ICD-10-CM | POA: Diagnosis present

## 2021-05-01 DIAGNOSIS — O9962 Diseases of the digestive system complicating childbirth: Secondary | ICD-10-CM | POA: Diagnosis present

## 2021-05-01 DIAGNOSIS — Z3A37 37 weeks gestation of pregnancy: Secondary | ICD-10-CM | POA: Diagnosis not present

## 2021-05-01 DIAGNOSIS — F32A Depression, unspecified: Secondary | ICD-10-CM | POA: Diagnosis present

## 2021-05-01 DIAGNOSIS — Z20822 Contact with and (suspected) exposure to covid-19: Secondary | ICD-10-CM | POA: Diagnosis present

## 2021-05-01 DIAGNOSIS — O134 Gestational [pregnancy-induced] hypertension without significant proteinuria, complicating childbirth: Principal | ICD-10-CM | POA: Diagnosis present

## 2021-05-01 DIAGNOSIS — Z880 Allergy status to penicillin: Secondary | ICD-10-CM | POA: Diagnosis not present

## 2021-05-01 DIAGNOSIS — Z3403 Encounter for supervision of normal first pregnancy, third trimester: Secondary | ICD-10-CM

## 2021-05-01 DIAGNOSIS — O165 Unspecified maternal hypertension, complicating the puerperium: Secondary | ICD-10-CM | POA: Diagnosis not present

## 2021-05-01 DIAGNOSIS — F1290 Cannabis use, unspecified, uncomplicated: Secondary | ICD-10-CM | POA: Diagnosis not present

## 2021-05-01 LAB — URINE DRUG SCREEN, QUALITATIVE (ARMC ONLY)
Amphetamines, Ur Screen: NOT DETECTED
Barbiturates, Ur Screen: NOT DETECTED
Benzodiazepine, Ur Scrn: NOT DETECTED
Cannabinoid 50 Ng, Ur ~~LOC~~: POSITIVE — AB
Cocaine Metabolite,Ur ~~LOC~~: NOT DETECTED
MDMA (Ecstasy)Ur Screen: NOT DETECTED
Methadone Scn, Ur: NOT DETECTED
Opiate, Ur Screen: NOT DETECTED
Phencyclidine (PCP) Ur S: NOT DETECTED
Tricyclic, Ur Screen: NOT DETECTED

## 2021-05-01 LAB — RESP PANEL BY RT-PCR (FLU A&B, COVID) ARPGX2
Influenza A by PCR: NEGATIVE
Influenza B by PCR: NEGATIVE
SARS Coronavirus 2 by RT PCR: NEGATIVE

## 2021-05-01 LAB — COMPREHENSIVE METABOLIC PANEL
ALT: 17 U/L (ref 0–44)
AST: 18 U/L (ref 15–41)
Albumin: 3.4 g/dL — ABNORMAL LOW (ref 3.5–5.0)
Alkaline Phosphatase: 106 U/L (ref 38–126)
Anion gap: 10 (ref 5–15)
BUN: <5 mg/dL — ABNORMAL LOW (ref 6–20)
CO2: 21 mmol/L — ABNORMAL LOW (ref 22–32)
Calcium: 8.6 mg/dL — ABNORMAL LOW (ref 8.9–10.3)
Chloride: 103 mmol/L (ref 98–111)
Creatinine, Ser: 0.46 mg/dL (ref 0.44–1.00)
GFR, Estimated: >60 mL/min (ref >60)
Glucose, Bld: 81 mg/dL (ref 70–99)
Potassium: 3.6 mmol/L (ref 3.5–5.1)
Sodium: 134 mmol/L — ABNORMAL LOW (ref 135–145)
Total Bilirubin: 0.5 mg/dL (ref 0.3–1.2)
Total Protein: 6.6 g/dL (ref 6.5–8.1)

## 2021-05-01 LAB — CBC
HCT: 33.4 % — ABNORMAL LOW (ref 36.0–46.0)
Hemoglobin: 11.7 g/dL — ABNORMAL LOW (ref 12.0–15.0)
MCH: 30.8 pg (ref 26.0–34.0)
MCHC: 35 g/dL (ref 30.0–36.0)
MCV: 87.9 fL (ref 80.0–100.0)
Platelets: 265 10*3/uL (ref 150–400)
RBC: 3.8 MIL/uL — ABNORMAL LOW (ref 3.87–5.11)
RDW: 12.6 % (ref 11.5–15.5)
WBC: 10.8 10*3/uL — ABNORMAL HIGH (ref 4.0–10.5)
nRBC: 0 % (ref 0.0–0.2)

## 2021-05-01 LAB — POCT URINALYSIS DIPSTICK OB
Bilirubin, UA: NEGATIVE
Blood, UA: NEGATIVE
Glucose, UA: NEGATIVE
Nitrite, UA: NEGATIVE
Spec Grav, UA: 1.01 (ref 1.010–1.025)
Urobilinogen, UA: 0.2 E.U./dL
pH, UA: 7.5 (ref 5.0–8.0)

## 2021-05-01 LAB — PROTEIN / CREATININE RATIO, URINE
Creatinine, Urine: 131 mg/dL
Protein Creatinine Ratio: 0.26 mg/mg{Cre} — ABNORMAL HIGH (ref 0.00–0.15)
Total Protein, Urine: 34 mg/dL

## 2021-05-01 LAB — ABO/RH: ABO/RH(D): O POS

## 2021-05-01 MED ORDER — OXYCODONE-ACETAMINOPHEN 5-325 MG PO TABS: 1.0000 | ORAL_TABLET | ORAL | Status: DC | PRN

## 2021-05-01 MED ORDER — LABETALOL HCL 5 MG/ML IV SOLN: 40.0000 mg | INTRAVENOUS | Status: DC | PRN

## 2021-05-01 MED ORDER — NIFEDIPINE 10 MG PO CAPS: 20.0000 mg | ORAL_CAPSULE | ORAL | Status: DC | PRN

## 2021-05-01 MED ORDER — SOD CITRATE-CITRIC ACID 500-334 MG/5ML PO SOLN: 30.0000 mL | ORAL | Status: DC | PRN

## 2021-05-01 MED ORDER — ACETAMINOPHEN 325 MG PO TABS: 650.0000 mg | ORAL_TABLET | ORAL | Status: DC | PRN

## 2021-05-01 MED ORDER — OXYTOCIN-SODIUM CHLORIDE 30-0.9 UT/500ML-% IV SOLN: 2.5000 [IU]/h | INTRAVENOUS | Status: DC

## 2021-05-01 MED ORDER — LACTATED RINGERS IV SOLN
500.0000 mL | INTRAVENOUS | Status: DC | PRN
Start: 2021-05-01 — End: 2021-05-02
  Administered 2021-05-02: 500 mL via INTRAVENOUS
  Administered 2021-05-02: 200 mL via INTRAVENOUS

## 2021-05-01 MED ORDER — OXYCODONE-ACETAMINOPHEN 5-325 MG PO TABS: 2.0000 | ORAL_TABLET | ORAL | Status: DC | PRN

## 2021-05-01 MED ORDER — MISOPROSTOL 50MCG HALF TABLET
50.0000 ug | ORAL_TABLET | ORAL | Status: DC | PRN
  Administered 2021-05-01 (×2): 50 ug via VAGINAL
  Filled 2021-05-01 (×3): qty 1

## 2021-05-01 MED ORDER — OXYTOCIN-SODIUM CHLORIDE 30-0.9 UT/500ML-% IV SOLN
INTRAVENOUS | Status: AC
  Filled 2021-05-01: qty 1000

## 2021-05-01 MED ORDER — OXYTOCIN BOLUS FROM INFUSION: 333.0000 mL | Freq: Once | INTRAVENOUS | Status: DC

## 2021-05-01 MED ORDER — LIDOCAINE HCL (PF) 1 % IJ SOLN: 30.0000 mL | INTRAMUSCULAR | Status: DC | PRN

## 2021-05-01 MED ORDER — MISOPROSTOL 50MCG HALF TABLET
50.0000 ug | ORAL_TABLET | Freq: Once | ORAL | Status: AC
  Administered 2021-05-01: 50 ug via ORAL

## 2021-05-01 MED ORDER — ZOLPIDEM TARTRATE 5 MG PO TABS: 5.0000 mg | ORAL_TABLET | Freq: Every evening | ORAL | Status: DC | PRN

## 2021-05-01 MED ORDER — NIFEDIPINE 10 MG PO CAPS: 10.0000 mg | ORAL_CAPSULE | ORAL | Status: DC | PRN

## 2021-05-01 MED ORDER — MISOPROSTOL 200 MCG PO TABS
ORAL_TABLET | ORAL | Status: AC
  Filled 2021-05-01: qty 4

## 2021-05-01 MED ORDER — MISOPROSTOL 50MCG HALF TABLET
50.0000 ug | ORAL_TABLET | Freq: Once | ORAL | Status: AC
  Administered 2021-05-01: 50 ug via ORAL
  Filled 2021-05-01: qty 1

## 2021-05-01 MED ORDER — ONDANSETRON HCL 4 MG/2ML IJ SOLN
4.0000 mg | Freq: Four times a day (QID) | INTRAMUSCULAR | Status: DC | PRN
  Administered 2021-05-01 – 2021-05-02 (×2): 4 mg via INTRAVENOUS
  Filled 2021-05-01 (×2): qty 2

## 2021-05-01 MED ORDER — OXYTOCIN-SODIUM CHLORIDE 30-0.9 UT/500ML-% IV SOLN
1.0000 m[IU]/min | INTRAVENOUS | Status: DC
Start: 2021-05-01 — End: 2021-05-02
  Administered 2021-05-02: 2 m[IU]/min via INTRAVENOUS

## 2021-05-01 MED ORDER — LACTATED RINGERS IV SOLN: INTRAVENOUS | Status: DC

## 2021-05-01 MED ORDER — AMMONIA AROMATIC IN INHA
RESPIRATORY_TRACT | Status: AC
  Filled 2021-05-01: qty 10

## 2021-05-01 MED ORDER — TERBUTALINE SULFATE 1 MG/ML IJ SOLN: 0.2500 mg | Freq: Once | INTRAMUSCULAR | Status: DC | PRN

## 2021-05-01 MED ORDER — OXYTOCIN 10 UNIT/ML IJ SOLN
INTRAMUSCULAR | Status: AC
  Filled 2021-05-01: qty 2

## 2021-05-01 MED ORDER — LIDOCAINE HCL (PF) 1 % IJ SOLN
INTRAMUSCULAR | Status: AC
  Filled 2021-05-01: qty 30

## 2021-05-01 NOTE — Progress Notes (Signed)
Intrapartum Progress Note  S: Patient denies complaints.   O:  Vitals:   05/01/21 1047 05/01/21 1102 05/01/21 1315 05/01/21 1634  BP: (!) 150/95 (!) 158/98 (!) 148/89 (!) 140/92  Pulse: (!) 111 (!) 114 (!) 114 (!) 106  Resp:      Temp:   98 F (36.7 C) 98.2 F (36.8 C)  TempSrc:   Oral Oral  Weight:      Height:        Gen App: NAD, comfortable. Up on birthing ball.  Abdomen: soft, gravid FHT: baseline 135 bpm.  Accels present.  Decels absent. Moderate variability.   Tocometer: contractions q 1-3 minutes, irregular Cervix: 1/20-30/-3 Extremities: Nontender, no edema.  Pitocin: None  Labs:  Results for orders placed or performed during the hospital encounter of 05/01/21  Protein / creatinine ratio, urine  Result Value Ref Range   Creatinine, Urine 131 mg/dL   Total Protein, Urine 34 mg/dL   Protein Creatinine Ratio 0.26 (H) 0.00 - 0.15 mg/mg[Cre]  Type and screen [facility]  Result Value Ref Range   ABO/RH(D) O POS    Antibody Screen NEG    Sample Expiration      05/04/2021,2359 Performed at [facility], [address] [place], [place] [address]      Assessment:  1: SIUP at [redacted]w[redacted]d 2. Gestational HTN 3. GBS negative  Plan:  1. Continue induction of labor, second dose of Cytotec placed.  2. Continue to monitor. Fetal status reassuring.    Hildred Laser, MD 05/01/2021 6:25 PM

## 2021-05-01 NOTE — Progress Notes (Addendum)
Intrapartum Progress Note  S: Patient notes she has begun feeling her contractions.   O:  Vitals:   05/01/21 1634 05/01/21 1918 05/01/21 2110 05/01/21 2255  BP: (!) 140/92 140/83 136/79 135/81  Pulse: (!) 106 92 92 88  Resp:  15    Temp: 98.2 F (36.8 C) 98.5 F (36.9 C)    TempSrc: Oral Oral    Weight:      Height:        Gen App: NAD, comfortable. Up on birthing ball.  Abdomen: soft, gravid FHT: baseline 135 bpm.  Accels present.  Decels  occasional shallow variable deceleration noted . Moderate variability.   Tocometer: contractions q 1-3 minutes Cervix: 1/30-40/-3 Extremities: Nontender, no edema.  Pitocin: None  Labs:  No new labs   Assessment:  1: SIUP at [redacted]w[redacted]d 2. Gestational HTN 3. GBS negative  Plan:  1. Continue induction of labor, third dose of Cytotec administered.  Foley bulb inserted with use of speculum.  2. Continue to monitor. Fetal status overall reassuring with occasional shallow variable deceleraton.  3. GHTN, no severe range BPs noted at this time. Can treat severe range with Procardia.    Hildred Laser, MD 05/01/2021 11:59 PM

## 2021-05-01 NOTE — Progress Notes (Signed)
OB-Pt present for bp check. Pt having elevated bp readings at home. Pt was seen at Labor and Delivery yesterday due to elevated bp.  First bp check was 156/98 rechecked manually 5 minutes later bp 158/102.

## 2021-05-01 NOTE — H&P (Addendum)
Obstetric History and Physical  Tasha Foster is a 25 y.o. G1P0 with IUP at [redacted]w[redacted]d presenting from the office for worsening blood pressures. She was recently diagnosed with gestational HTN several days ago (milder range).  BPs in office today 156/98 and 158/102. Patient states she has not been having contractions,  denies  vaginal bleeding, has intact membranes, with active fetal movement.  She denies headaches, blurred vision, or RUQ pain.   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   Prenatal labs and studies: ABO, Rh: O/Positive/-- (01/28 1003) Antibody: Negative (01/28 1003) Rubella: 2.89 (01/28 1003) RPR: Non Reactive (06/08 1029)  HBsAg: Negative (01/28 1003)  HIV: Non Reactive (01/28 1003)  GBS:--/Negative (08/10 1020) 1 hr Glucola normal (98) Genetic screening normal [id number] Anatomy US normal   Past Medical History:  Diagnosis Date   Abnormal exam of left ear    Anxiety    Depression     History reviewed. No pertinent surgical history.  OB History  Gravida Para Term Preterm AB Living  1            SAB IAB Ectopic Multiple Live Births               # Outcome Date GA Lbr Len/2nd Weight Sex Delivery Anes PTL Lv  1 Current             Social History   Socioeconomic History   Marital status: Married    Spouse name: Not on file   Number of children: Not on file   Years of education: Not on file   Highest education level: Not on file  Occupational History   Not on file  Tobacco Use   Smoking status: Never    Smokeless tobacco: Never  Vaping Use   Vaping Use: Never used  Substance and Sexual Activity   Alcohol use: Not Currently    Comment: 2 bottles of wine a week   Drug use: Not Currently    Comment: CBD vape for nausea   Sexual activity: Yes    Partners: Male    Birth control/protection: None  Other Topics Concern   Not on file  Social History Narrative   Not on file   Social Determinants of Health   Financial Resource Strain: Not on file  Food Insecurity: Not on file  Transportation Needs: Not on file  Physical Activity: Not on file  Stress: Not on file  Social Connections: Not on file    Family History  Problem Relation Age of Onset   Seizures Mother    Hepatitis Mother    Migraines Mother    Hypertension Father    Migraines Father    Mood Disorder Father        mania   Migraines Maternal Grandmother    Migraines Maternal Grandfather    Migraines Paternal Grandmother    Hypertension Paternal Grandfather    Diabetes Paternal Grandfather    Migraines Paternal Grandfather    Clotting disorder Paternal Grandfather        blood clots in legs    Medications Prior to  Admission  Medication Sig Dispense Refill Last Dose   pantoprazole (PROTONIX) 20 MG tablet Take 1 tablet (20 mg total) by mouth daily. 30 tablet 2 04/30/2021   Prenatal Vit-Fe Fumarate-FA (MULTIVITAMIN-PRENATAL) 27-0.8 MG TABS tablet Take 1 tablet by mouth daily at 12 noon.   04/30/2021    Allergies  Allergen Reactions   Amoxicillin Hives and Swelling    Of throat   Penicillins     Review of Systems: Negative except for what is mentioned in HPI.  Physical Exam: Vitals:   05/01/21 1015 05/01/21 1047 05/01/21 1102 05/01/21 1315  BP: (!) 152/96 (!) 150/95 (!) 158/98 (!) 148/89  Pulse: (!) 120 (!) 111 (!) 114 (!) 114  Resp:      Temp:    98 F (36.7 C)  TempSrc:    Oral  Weight:      Height:        CONSTITUTIONAL: Well-developed, well-nourished female in no acute distress.  HENT:   Normocephalic, atraumatic, External right and left ear normal. Oropharynx is clear and moist EYES: Conjunctivae and EOM are normal. Pupils are equal, round, and reactive to light. No scleral icterus.  NECK: Normal range of motion, supple, no masses SKIN: Skin is warm and dry. No rash noted. Not diaphoretic. No erythema. No pallor. NEUROLOGIC: Alert and oriented to person, place, and time. Normal reflexes, muscle tone coordination. No cranial nerve deficit noted. PSYCHIATRIC: Normal mood and affect. Normal behavior. Normal judgment and thought content. CARDIOVASCULAR: Normal heart rate noted, regular rhythm RESPIRATORY: Effort and breath sounds normal, no problems with respiration noted ABDOMEN: Soft, nontender, nondistended, gravid. MUSCULOSKELETAL: Normal range of motion. No edema and no tenderness. 2+ distal pulses.  Cervical Exam: Dilatation 0.5 cm   Effacement thick %   Station -3   Presentation: cephalic FHT:  Baseline rate 155 bpm   Variability moderate  Accelerations present   Decelerations none Contractions: None   Pertinent Labs/Studies:   Results for orders placed or performed during the hospital encounter of 05/01/21 (from the past 24 hour(s))  Resp Panel by RT-PCR (Flu A&B, Covid) Nasopharyngeal Swab     Status: None   Collection Time: 05/01/21 11:24 AM   Specimen: Nasopharyngeal Swab; Nasopharyngeal(NP) swabs in vial transport medium  Result Value Ref Range   SARS Coronavirus 2 by RT PCR NEGATIVE NEGATIVE   Influenza A by PCR NEGATIVE NEGATIVE   Influenza B by PCR NEGATIVE NEGATIVE  CBC     Status: Abnormal   Collection Time: 05/01/21 12:38 PM  Result Value Ref Range   WBC 10.8 (H) 4.0 - 10.5 K/uL   RBC 3.80 (L) 3.87 - 5.11 MIL/uL   Hemoglobin 11.7 (L) 12.0 - 15.0 g/dL   HCT 19.1 (L) 47.8 - 29.5 %   MCV 87.9 80.0 - 100.0 fL   MCH 30.8 26.0 - 34.0 pg   MCHC 35.0 30.0 - 36.0 g/dL   RDW 62.1 30.8 - 65.7 %   Platelets 265 150 - 400 K/uL   nRBC 0.0 0.0 - 0.2 %  Type and  screen [facility]     Status: None (Preliminary result)   Collection Time: 05/01/21 12:38 PM  Result Value Ref Range   ABO/RH(D) PENDING    Antibody Screen PENDING    Sample Expiration      05/04/2021,2359 Performed at [facility], [address], [place], [place] [address]   Comprehensive metabolic panel     Status: Abnormal   Collection Time: 05/01/21 12:38 PM  Result Value Ref Range   Sodium 134 (L) 135 - 145 mmol/L   Potassium 3.6 3.5 - 5.1 mmol/L   Chloride 103 98 - 111 mmol/L   CO2 21 (L) 22 - 32 mmol/L   Glucose, Bld 81 70 - 99 mg/dL   BUN <5 (L) 6 - 20 mg/dL   Creatinine, Ser 9.38 0.44 - 1.00 mg/dL   Calcium 8.6 (L) 8.9 - 10.3 mg/dL   Total Protein 6.6 6.5 - 8.1 g/dL   Albumin 3.4 (L) 3.5 - 5.0 g/dL   AST 18 15 - 41 U/L   ALT 17 0 - 44 U/L   Alkaline Phosphatase 106 38 - 126 U/L   Total Bilirubin 0.5 0.3 - 1.2 mg/dL   GFR, Estimated >18 >29 mL/min   Anion gap 10 5 - 15    Assessment : Sheyli Corso is a 25 y.o. G1P0 at [redacted]w[redacted]d being admitted for induction of labor due to gestational HTN at term. H/o anxiety and depression this pregnancy, no medications.  GBS negative. Marijuana use in early pregnancy, in remission.   Plan: Labor: Induction as ordered with Cytotec as per protocol. Analgesia as needed. Repeat PIH labs ordered.  Procardia as needed to treat severe range pressures if present. Repeat UDS on admission.  FWB: Reassuring fetal heart tracing.  GBS negative Delivery plan: Hopeful for vaginal delivery    Hildred Laser, MD [facility]

## 2021-05-01 NOTE — Progress Notes (Signed)
Patient presented for BP check.  BPs 156/98, 158/102.  Has h/o GHTN.  Will send to L&D for for further evaluation and possible delivery. Discussed likelihood of induction.

## 2021-05-02 ENCOUNTER — Inpatient Hospital Stay: Payer: Medicaid Other | Admitting: Anesthesiology

## 2021-05-02 ENCOUNTER — Encounter: Payer: Self-pay | Admitting: Obstetrics and Gynecology

## 2021-05-02 ENCOUNTER — Encounter
Admission: EM | Disposition: A | Discharge status: Home / Self Care | Payer: Self-pay | Source: Home / Self Care | Attending: Obstetrics and Gynecology

## 2021-05-02 DIAGNOSIS — O133 Gestational [pregnancy-induced] hypertension without significant proteinuria, third trimester: Secondary | ICD-10-CM

## 2021-05-02 DIAGNOSIS — Z3A37 37 weeks gestation of pregnancy: Secondary | ICD-10-CM

## 2021-05-02 LAB — HEMOGLOBIN AND HEMATOCRIT, BLOOD
HCT: 26.8 % — ABNORMAL LOW (ref 36.0–46.0)
Hemoglobin: 9.2 g/dL — ABNORMAL LOW (ref 12.0–15.0)

## 2021-05-02 LAB — RPR: RPR Ser Ql: NONREACTIVE

## 2021-05-02 SURGERY — Surgical Case
Anesthesia: Epidural

## 2021-05-02 MED ORDER — SODIUM CHLORIDE 0.9 % IV SOLN
INTRAVENOUS | Status: DC | PRN
  Administered 2021-05-02: 8 mL via EPIDURAL

## 2021-05-02 MED ORDER — OXYTOCIN-SODIUM CHLORIDE 30-0.9 UT/500ML-% IV SOLN
2.5000 [IU]/h | INTRAVENOUS | Status: AC
  Administered 2021-05-02: 2.5 [IU]/h via INTRAVENOUS
  Filled 2021-05-02: qty 500

## 2021-05-02 MED ORDER — DIPHENHYDRAMINE HCL 50 MG/ML IJ SOLN: 12.5000 mg | INTRAMUSCULAR | Status: DC | PRN

## 2021-05-02 MED ORDER — LACTATED RINGERS IV SOLN: 500.0000 mL | Freq: Once | INTRAVENOUS | Status: DC

## 2021-05-02 MED ORDER — WITCH HAZEL-GLYCERIN EX PADS: 1.0000 "application " | MEDICATED_PAD | CUTANEOUS | Status: DC | PRN

## 2021-05-02 MED ORDER — METHYLERGONOVINE MALEATE 0.2 MG/ML IJ SOLN
INTRAMUSCULAR | Status: AC
  Filled 2021-05-02: qty 1

## 2021-05-02 MED ORDER — FENTANYL CITRATE (PF) 100 MCG/2ML IJ SOLN
INTRAMUSCULAR | Status: AC
  Filled 2021-05-02: qty 2

## 2021-05-02 MED ORDER — TRANEXAMIC ACID-NACL 1000-0.7 MG/100ML-% IV SOLN
INTRAVENOUS | Status: DC | PRN
  Administered 2021-05-02: 1000 mg via INTRAVENOUS

## 2021-05-02 MED ORDER — SENNOSIDES-DOCUSATE SODIUM 8.6-50 MG PO TABS
2.0000 | ORAL_TABLET | Freq: Every day | ORAL | Status: DC
  Administered 2021-05-03 – 2021-05-05 (×2): 2 via ORAL
  Filled 2021-05-02 (×3): qty 2

## 2021-05-02 MED ORDER — SODIUM CHLORIDE (PF) 0.9 % IJ SOLN
INTRAMUSCULAR | Status: AC
  Filled 2021-05-02: qty 50

## 2021-05-02 MED ORDER — DIPHENHYDRAMINE HCL 25 MG PO CAPS: 25.0000 mg | ORAL_CAPSULE | Freq: Four times a day (QID) | ORAL | Status: DC | PRN

## 2021-05-02 MED ORDER — FENTANYL CITRATE (PF) 100 MCG/2ML IJ SOLN
INTRAMUSCULAR | Status: DC | PRN
  Administered 2021-05-02: 100 ug via EPIDURAL

## 2021-05-02 MED ORDER — MENTHOL 3 MG MT LOZG
1.0000 | LOZENGE | OROMUCOSAL | Status: DC | PRN
  Filled 2021-05-02: qty 9

## 2021-05-02 MED ORDER — BUTORPHANOL TARTRATE 1 MG/ML IJ SOLN
1.0000 mg | INTRAMUSCULAR | Status: DC | PRN
  Administered 2021-05-02 (×3): 1 mg via INTRAVENOUS
  Filled 2021-05-02 (×2): qty 1

## 2021-05-02 MED ORDER — NALBUPHINE HCL 10 MG/ML IJ SOLN: 5.0000 mg | INTRAMUSCULAR | Status: DC | PRN

## 2021-05-02 MED ORDER — NALOXONE HCL 0.4 MG/ML IJ SOLN: 0.4000 mg | INTRAMUSCULAR | Status: DC | PRN

## 2021-05-02 MED ORDER — SCOPOLAMINE 1 MG/3DAYS TD PT72: 1.0000 | MEDICATED_PATCH | Freq: Once | TRANSDERMAL | Status: DC

## 2021-05-02 MED ORDER — EPHEDRINE SULFATE-NACL 50-0.9 MG/10ML-% IV SOSY
PREFILLED_SYRINGE | INTRAVENOUS | Status: DC | PRN
  Administered 2021-05-02: 15 mg via INTRAVENOUS
  Administered 2021-05-02: 10 mg via INTRAVENOUS

## 2021-05-02 MED ORDER — PHENYLEPHRINE 40 MCG/ML (10ML) SYRINGE FOR IV PUSH (FOR BLOOD PRESSURE SUPPORT): 80.0000 ug | PREFILLED_SYRINGE | INTRAVENOUS | Status: DC | PRN

## 2021-05-02 MED ORDER — SOD CITRATE-CITRIC ACID 500-334 MG/5ML PO SOLN
ORAL | Status: AC
  Administered 2021-05-02: 30 mL via ORAL
  Filled 2021-05-02: qty 15

## 2021-05-02 MED ORDER — ONDANSETRON HCL 4 MG/2ML IJ SOLN
INTRAMUSCULAR | Status: AC
  Filled 2021-05-02: qty 2

## 2021-05-02 MED ORDER — EPHEDRINE 5 MG/ML INJ: 10.0000 mg | INTRAVENOUS | Status: DC | PRN

## 2021-05-02 MED ORDER — KETOROLAC TROMETHAMINE 30 MG/ML IJ SOLN: 30.0000 mg | Freq: Four times a day (QID) | INTRAMUSCULAR | Status: DC

## 2021-05-02 MED ORDER — SIMETHICONE 80 MG PO CHEW
80.0000 mg | CHEWABLE_TABLET | ORAL | Status: DC | PRN
  Administered 2021-05-03 – 2021-05-04 (×3): 80 mg via ORAL
  Filled 2021-05-02 (×3): qty 1

## 2021-05-02 MED ORDER — CEFAZOLIN SODIUM-DEXTROSE 2-4 GM/100ML-% IV SOLN
INTRAVENOUS | Status: AC
  Filled 2021-05-02: qty 100

## 2021-05-02 MED ORDER — LACTATED RINGERS IV SOLN: INTRAVENOUS | Status: DC

## 2021-05-02 MED ORDER — COCONUT OIL OIL
1.0000 "application " | TOPICAL_OIL | Status: DC | PRN
  Filled 2021-05-02 (×2): qty 120

## 2021-05-02 MED ORDER — ONDANSETRON HCL 4 MG/2ML IJ SOLN
INTRAMUSCULAR | Status: DC | PRN
  Administered 2021-05-02: 4 mg via INTRAVENOUS

## 2021-05-02 MED ORDER — MORPHINE SULFATE (PF) 0.5 MG/ML IJ SOLN
INTRAMUSCULAR | Status: AC
  Filled 2021-05-02: qty 10

## 2021-05-02 MED ORDER — PRENATAL MULTIVITAMIN CH
1.0000 | ORAL_TABLET | Freq: Every day | ORAL | Status: DC
  Administered 2021-05-03 – 2021-05-05 (×3): 1 via ORAL
  Filled 2021-05-02 (×3): qty 1

## 2021-05-02 MED ORDER — SOD CITRATE-CITRIC ACID 500-334 MG/5ML PO SOLN
30.0000 mL | Freq: Once | ORAL | Status: AC
  Administered 2021-05-02: 30 mL via ORAL

## 2021-05-02 MED ORDER — SODIUM CHLORIDE 0.9% FLUSH: 3.0000 mL | INTRAVENOUS | Status: DC | PRN

## 2021-05-02 MED ORDER — DEXAMETHASONE SODIUM PHOSPHATE 10 MG/ML IJ SOLN
INTRAMUSCULAR | Status: DC | PRN
  Administered 2021-05-02: 10 mg via INTRAVENOUS

## 2021-05-02 MED ORDER — BUPIVACAINE HCL (PF) 0.5 % IJ SOLN
INTRAMUSCULAR | Status: AC
  Filled 2021-05-02: qty 30

## 2021-05-02 MED ORDER — OXYCODONE-ACETAMINOPHEN 5-325 MG PO TABS: 2.0000 | ORAL_TABLET | ORAL | Status: DC | PRN

## 2021-05-02 MED ORDER — NALBUPHINE HCL 10 MG/ML IJ SOLN: 5.0000 mg | Freq: Once | INTRAMUSCULAR | Status: DC | PRN

## 2021-05-02 MED ORDER — MAGNESIUM HYDROXIDE 400 MG/5ML PO SUSP
30.0000 mL | ORAL | Status: DC | PRN
  Filled 2021-05-02: qty 30

## 2021-05-02 MED ORDER — BUTORPHANOL TARTRATE 1 MG/ML IJ SOLN
INTRAMUSCULAR | Status: AC
  Filled 2021-05-02: qty 1

## 2021-05-02 MED ORDER — BUPIVACAINE LIPOSOME 1.3 % IJ SUSP
INTRAMUSCULAR | Status: AC
  Filled 2021-05-02: qty 20

## 2021-05-02 MED ORDER — SODIUM CHLORIDE 0.9 % IV SOLN
INTRAVENOUS | Status: DC | PRN
  Administered 2021-05-02: 500 mg via INTRAVENOUS

## 2021-05-02 MED ORDER — DIBUCAINE (PERIANAL) 1 % EX OINT: 1.0000 "application " | TOPICAL_OINTMENT | CUTANEOUS | Status: DC | PRN

## 2021-05-02 MED ORDER — HYDROMORPHONE HCL 1 MG/ML IJ SOLN: 1.0000 mg | INTRAMUSCULAR | Status: DC | PRN

## 2021-05-02 MED ORDER — ACETAMINOPHEN 500 MG PO TABS: 1000.0000 mg | ORAL_TABLET | Freq: Four times a day (QID) | ORAL | Status: DC

## 2021-05-02 MED ORDER — MISOPROSTOL 200 MCG PO TABS: 800.0000 ug | ORAL_TABLET | Freq: Once | ORAL | Status: AC

## 2021-05-02 MED ORDER — METHYLERGONOVINE MALEATE 0.2 MG/ML IJ SOLN: 0.2000 mg | Freq: Once | INTRAMUSCULAR | Status: DC

## 2021-05-02 MED ORDER — OXYCODONE HCL 5 MG PO TABS: 5.0000 mg | ORAL_TABLET | ORAL | Status: DC | PRN

## 2021-05-02 MED ORDER — DEXAMETHASONE SODIUM PHOSPHATE 10 MG/ML IJ SOLN
INTRAMUSCULAR | Status: AC
  Filled 2021-05-02: qty 1

## 2021-05-02 MED ORDER — ZOLPIDEM TARTRATE 5 MG PO TABS: 5.0000 mg | ORAL_TABLET | Freq: Every evening | ORAL | Status: DC | PRN

## 2021-05-02 MED ORDER — MISOPROSTOL 200 MCG PO TABS
ORAL_TABLET | ORAL | Status: AC
  Administered 2021-05-02: 800 ug via ORAL
  Filled 2021-05-02: qty 4

## 2021-05-02 MED ORDER — SODIUM CHLORIDE FLUSH 0.9 % IV SOLN
INTRAVENOUS | Status: DC | PRN
  Administered 2021-05-02: 100 mL

## 2021-05-02 MED ORDER — FERROUS SULFATE 325 (65 FE) MG PO TABS
325.0000 mg | ORAL_TABLET | ORAL | Status: DC
  Administered 2021-05-03 – 2021-05-05 (×3): 325 mg via ORAL
  Filled 2021-05-02 (×3): qty 1

## 2021-05-02 MED ORDER — FENTANYL-BUPIVACAINE-NACL 0.5-0.125-0.9 MG/250ML-% EP SOLN
EPIDURAL | Status: AC
  Filled 2021-05-02: qty 250

## 2021-05-02 MED ORDER — IBUPROFEN 600 MG PO TABS
600.0000 mg | ORAL_TABLET | Freq: Four times a day (QID) | ORAL | Status: DC
  Administered 2021-05-03 – 2021-05-05 (×7): 600 mg via ORAL
  Filled 2021-05-02 (×7): qty 1

## 2021-05-02 MED ORDER — OXYCODONE HCL 5 MG PO TABS
5.0000 mg | ORAL_TABLET | ORAL | Status: DC | PRN
  Administered 2021-05-03: 5 mg via ORAL
  Administered 2021-05-04: 10 mg via ORAL
  Administered 2021-05-04: 5 mg via ORAL
  Administered 2021-05-04 – 2021-05-05 (×4): 10 mg via ORAL
  Filled 2021-05-02 (×2): qty 2
  Filled 2021-05-02 (×2): qty 1
  Filled 2021-05-02 (×3): qty 2

## 2021-05-02 MED ORDER — GABAPENTIN 300 MG PO CAPS
300.0000 mg | ORAL_CAPSULE | Freq: Two times a day (BID) | ORAL | Status: DC
  Administered 2021-05-03 – 2021-05-05 (×6): 300 mg via ORAL
  Filled 2021-05-02 (×7): qty 1

## 2021-05-02 MED ORDER — EPHEDRINE 5 MG/ML INJ
INTRAVENOUS | Status: AC
  Filled 2021-05-02: qty 5

## 2021-05-02 MED ORDER — SODIUM CHLORIDE 0.9 % IV SOLN
INTRAVENOUS | Status: AC
  Filled 2021-05-02: qty 500

## 2021-05-02 MED ORDER — LIDOCAINE-EPINEPHRINE (PF) 1.5 %-1:200000 IJ SOLN
INTRAMUSCULAR | Status: DC | PRN
  Administered 2021-05-02: 3 mL via EPIDURAL

## 2021-05-02 MED ORDER — LIDOCAINE HCL (PF) 2 % IJ SOLN
INTRAMUSCULAR | Status: DC | PRN
  Administered 2021-05-02: 60 mg via EPIDURAL
  Administered 2021-05-02: 200 mg via EPIDURAL
  Administered 2021-05-02: 100 mg via EPIDURAL

## 2021-05-02 MED ORDER — MEASLES, MUMPS & RUBELLA VAC IJ SOLR
0.5000 mL | Freq: Once | INTRAMUSCULAR | Status: DC
Start: 2021-05-03 — End: 2021-05-02

## 2021-05-02 MED ORDER — OXYCODONE HCL 5 MG PO TABS: 10.0000 mg | ORAL_TABLET | ORAL | Status: DC | PRN

## 2021-05-02 MED ORDER — NALBUPHINE HCL 10 MG/ML IJ SOLN
5.0000 mg | INTRAMUSCULAR | Status: DC | PRN
Start: 2021-05-02 — End: 2021-05-05

## 2021-05-02 MED ORDER — CEFAZOLIN SODIUM-DEXTROSE 2-3 GM-%(50ML) IV SOLR
INTRAVENOUS | Status: DC | PRN
  Administered 2021-05-02: 2 g via INTRAVENOUS

## 2021-05-02 MED ORDER — ACETAMINOPHEN 500 MG PO TABS
1000.0000 mg | ORAL_TABLET | Freq: Four times a day (QID) | ORAL | Status: DC
  Administered 2021-05-03 – 2021-05-05 (×10): 1000 mg via ORAL
  Filled 2021-05-02 (×12): qty 2

## 2021-05-02 MED ORDER — FENTANYL-BUPIVACAINE-NACL 0.5-0.125-0.9 MG/250ML-% EP SOLN
12.0000 mL/h | EPIDURAL | Status: DC | PRN
Start: 2021-05-02 — End: 2021-05-02
  Administered 2021-05-02: 12 mL/h via EPIDURAL

## 2021-05-02 MED ORDER — TRANEXAMIC ACID-NACL 1000-0.7 MG/100ML-% IV SOLN
INTRAVENOUS | Status: AC
  Filled 2021-05-02: qty 100

## 2021-05-02 MED ORDER — KETOROLAC TROMETHAMINE 30 MG/ML IJ SOLN
30.0000 mg | Freq: Once | INTRAMUSCULAR | Status: AC
  Administered 2021-05-02: 30 mg via INTRAVENOUS
  Filled 2021-05-02: qty 1

## 2021-05-02 MED ORDER — MEPERIDINE HCL 25 MG/ML IJ SOLN: 6.2500 mg | INTRAMUSCULAR | Status: DC | PRN

## 2021-05-02 MED ORDER — KETOROLAC TROMETHAMINE 30 MG/ML IJ SOLN
30.0000 mg | Freq: Four times a day (QID) | INTRAMUSCULAR | Status: AC
  Administered 2021-05-03 (×3): 30 mg via INTRAVENOUS
  Filled 2021-05-02 (×3): qty 1

## 2021-05-02 MED ORDER — NALOXONE HCL 4 MG/10ML IJ SOLN
1.0000 ug/kg/h | INTRAVENOUS | Status: DC | PRN
  Filled 2021-05-02: qty 5

## 2021-05-02 MED ORDER — MORPHINE SULFATE (PF) 0.5 MG/ML IJ SOLN
INTRAMUSCULAR | Status: DC | PRN
  Administered 2021-05-02: 4 mg via EPIDURAL

## 2021-05-02 MED ORDER — PHENYLEPHRINE HCL (PRESSORS) 10 MG/ML IV SOLN
INTRAVENOUS | Status: DC | PRN
  Administered 2021-05-02 (×2): 100 ug via INTRAVENOUS

## 2021-05-02 MED ORDER — OXYTOCIN-SODIUM CHLORIDE 30-0.9 UT/500ML-% IV SOLN
INTRAVENOUS | Status: DC | PRN
  Administered 2021-05-02: 30 [IU] via INTRAVENOUS

## 2021-05-02 MED ORDER — ONDANSETRON HCL 4 MG/2ML IJ SOLN
4.0000 mg | Freq: Three times a day (TID) | INTRAMUSCULAR | Status: DC | PRN
  Administered 2021-05-02: 4 mg via INTRAVENOUS
  Filled 2021-05-02: qty 2

## 2021-05-02 SURGICAL SUPPLY — 30 items
BAG COUNTER SPONGE SURGICOUNT (BAG) ×2 IMPLANT
BNDG TENSOPLAST 6X5 (GAUZE/BANDAGES/DRESSINGS) ×2 IMPLANT
CHLORAPREP W/TINT 26 (MISCELLANEOUS) ×4 IMPLANT
CLOSURE STERI STRIP 1/2 X4 (GAUZE/BANDAGES/DRESSINGS) ×2 IMPLANT
DRESSING TELFA 8X3 (GAUZE/BANDAGES/DRESSINGS) ×2 IMPLANT
DRSG TELFA 3X8 NADH (GAUZE/BANDAGES/DRESSINGS) ×2 IMPLANT
ELECT REM PT RETURN 9FT ADLT (ELECTROSURGICAL) ×2
ELECTRODE REM PT RTRN 9FT ADLT (ELECTROSURGICAL) ×1 IMPLANT
EXTRT SYSTEM ALEXIS 17CM (MISCELLANEOUS)
GAUZE SPONGE 4X4 12PLY STRL (GAUZE/BANDAGES/DRESSINGS) ×2 IMPLANT
GLOVE SURG ENC MOIS LTX SZ6.5 (GLOVE) ×2 IMPLANT
GLOVE SURG UNDER LTX SZ7 (GLOVE) ×2 IMPLANT
GOWN STRL REUS W/ TWL LRG LVL3 (GOWN DISPOSABLE) ×2 IMPLANT
GOWN STRL REUS W/TWL LRG LVL3 (GOWN DISPOSABLE) ×2
KIT TURNOVER KIT A (KITS) ×2 IMPLANT
MANIFOLD NEPTUNE II (INSTRUMENTS) ×2 IMPLANT
MAT PREVALON FULL STRYKER (MISCELLANEOUS) ×2 IMPLANT
NS IRRIG 1000ML POUR BTL (IV SOLUTION) ×2 IMPLANT
PACK C SECTION AR (MISCELLANEOUS) ×2 IMPLANT
PAD OB MATERNITY 4.3X12.25 (PERSONAL CARE ITEMS) ×2 IMPLANT
PAD PREP 24X41 OB/GYN DISP (PERSONAL CARE ITEMS) ×2 IMPLANT
PENCIL SMOKE EVACUATOR (MISCELLANEOUS) ×2 IMPLANT
RTRCTR C-SECT PINK 25CM LRG (MISCELLANEOUS) ×2 IMPLANT
SCRUB EXIDINE 4% CHG 4OZ (MISCELLANEOUS) ×2 IMPLANT
SUT MNCRL AB 4-0 PS2 18 (SUTURE) ×2 IMPLANT
SUT PLAIN 2 0 XLH (SUTURE) IMPLANT
SUT VIC AB 0 CT1 36 (SUTURE) ×8 IMPLANT
SUT VIC AB 3-0 SH 27 (SUTURE) ×1
SUT VIC AB 3-0 SH 27X BRD (SUTURE) ×1 IMPLANT
SYSTEM CONTND EXTRCTN KII BLLN (MISCELLANEOUS) IMPLANT

## 2021-05-02 NOTE — Progress Notes (Signed)
Intrapartum Progress Note  S: Patient with no major complaints.   O:  Vitals:   05/02/21 1515 05/02/21 1520 05/02/21 1525 05/02/21 1555  BP:    132/88  Pulse:    99  Resp:      Temp:    98.9 F (37.2 C)  TempSrc:    Axillary  SpO2: 99% 97% 98% 98%  Weight:      Height:        Gen App: NAD, comfortable Abdomen: soft, gravid FHT: baseline 145 bpm.  Accels present.  Decels absent. Moderate variability.   Tocometer: contractions q 1-2 minutes Cervix: 5-6/80/ballotable. Extremities: Nontender, no edema.  Pitocin: 28 mIU  Labs:  No new labs   Assessment:  1: SIUP at [redacted]w[redacted]d 2. Gestational HTN 3. GBS negative 4. Failed IOL  Plan:  1. Discussion had with patient regarding management options.  No change noted over the past 4-5 hours, on 28 of Pitocin. Fetus still ballotable at this time despite AROM and multiple position changes to engage fetus into the pelvis.  Given option of pitocin break, with placement of IUPC on resumption of Pitocin, vs proceeding surgical intervention with C-section at this time.  Patient notes that she would rather proceed with C-section at this time.  Notes that she is mentally and physically exhausted and does not think that she could endure many more hours of labor.  The risks of surgery were discussed with the patient including but were not limited to: bleeding which may require transfusion or reoperation; infection which may require antibiotics; injury to bowel, bladder, ureters or other surrounding organs; injury to the fetus; need for additional procedures including hysterectomy in the event of a life-threatening hemorrhage; formation of adhesions; placental abnormalities with subsequent pregnancies; incisional problems; thromboembolic phenomenon and other postoperative/anesthesia complications.  The patient concurred with the proposed plan, giving informed written consent for the procedure.   Patient has been NPO except clears since ~ 8 pm yesterday, she  will remain NPO for procedure. Anesthesia and OR aware. Preoperative prophylactic antibiotics and SCDs ordered on call to the OR.  To OR when ready. 2. GHTN, no severe range BPs noted at this time.  3. Fetal status remains reassuring.    Hildred Laser, MD 05/02/2021 5:34 PM

## 2021-05-02 NOTE — Transfer of Care (Signed)
Immediate Anesthesia Transfer of Care Note  Patient: Tasha Foster  Procedure(s) Performed: CESAREAN SECTION  Patient Location: PACU and Mother/Baby  Anesthesia Type:Epidural  Level of Consciousness: awake and alert   Airway & Oxygen Therapy: Patient Spontanous Breathing  Post-op Assessment: Report given to RN  Post vital signs: stable  Last Vitals:  Vitals Value Taken Time  BP 134/76   Temp 97.6   Pulse 94134/76   Resp 20   SpO2 100     Last Pain:  Vitals:   05/02/21 1555  TempSrc: Axillary  PainSc:       Patients Stated Pain Goal: 0 (05/02/21 1012)  Complications: No notable events documented.

## 2021-05-02 NOTE — Progress Notes (Addendum)
Intrapartum Progress Note  S: Patient more uncomfortable with contractions. Noting some bloody show. Foley bulb expelled around 6:30 this morning.   O:  Vitals:   05/02/21 0205 05/02/21 0428 05/02/21 0625 05/02/21 0706  BP: 137/89 129/70  (!) 148/77  Pulse: 87 86  (!) 110  Resp:    18  Temp:   98.6 F (37 C) 98.1 F (36.7 C)  TempSrc:   Oral Oral  Weight:      Height:        Gen App: NAD, comfortable, just received dose of Stadol.  Abdomen: soft, gravid FHT: baseline 135 bpm.  Accels present.  Decels absent. Moderate variability.   Tocometer: contractions q 1-3 minutes Cervix: 3.5-4/50-60/-3 to ballotable Extremities: Nontender, no edema.  Pitocin: 8 mIU  Labs:  No new labs   Assessment:  1: SIUP at [redacted]w[redacted]d 2. Gestational HTN 3. GBS negative  Plan:  1. Continue induction of labor, with Pitocin.   2. AROM'd with moderate amount of clear fluid.  3. GHTN, no severe range BPs noted at this time. Can treat severe range with Procardia.  4. Fetal status reassuring.    Hildred Laser, MD 05/02/2021 9:16 AM

## 2021-05-02 NOTE — Anesthesia Procedure Notes (Signed)
Epidural Patient location during procedure: OB Start time: 05/02/2021 9:50 AM End time: 05/02/2021 10:18 AM  Staffing Anesthesiologist: Corinda Gubler, MD Performed: anesthesiologist   Preanesthetic Checklist Completed: patient identified, IV checked, site marked, risks and benefits discussed, surgical consent, monitors and equipment checked, pre-op evaluation and timeout performed  Epidural Patient position: sitting Prep: ChloraPrep Patient monitoring: heart rate, continuous pulse ox and blood pressure Approach: midline Location: L3-L4 Injection technique: LOR saline  Needle:  Needle type: Tuohy  Needle gauge: 17 G Needle length: 9 cm and 9 Needle insertion depth: 6 cm Catheter type: closed end flexible Catheter size: 19 Gauge Catheter at skin depth: 11 cm Test dose: negative and 1.5% lidocaine with Epi 1:200 K  Assessment Sensory level: T10 Events: blood not aspirated, injection not painful, no injection resistance, no paresthesia and negative IV test  Additional Notes first attempt Pt. Evaluated and documentation done after procedure finished. Patient identified. Risks/Benefits/Options discussed with patient including but not limited to bleeding, infection, nerve damage, paralysis, failed block, incomplete pain control, headache, blood pressure changes, nausea, vomiting, reactions to medication both or allergic, itching and postpartum back pain. Confirmed with bedside nurse the patient's most recent platelet count. Confirmed with patient that they are not currently taking any anticoagulation, have any bleeding history or any family history of bleeding disorders. Patient expressed understanding and wished to proceed. All questions were answered. Sterile technique was used throughout the entire procedure. Please see nursing notes for vital signs. Test dose was given through epidural catheter and negative prior to continuing to dose epidural or start infusion. Warning signs of high  block given to the patient including shortness of breath, tingling/numbness in hands, complete motor block, or any concerning symptoms with instructions to call for help. Patient was given instructions on fall risk and not to get out of bed. All questions and concerns addressed with instructions to call with any issues or inadequate analgesia.     Patient tolerated the insertion well without immediate complications.  Reason for block: procedure for painReason for block:procedure for pain

## 2021-05-02 NOTE — Progress Notes (Addendum)
Intrapartum Progress Note  S: Patient comfortable with epidural. Feeling some pressure in her bottom.  O:  Vitals:   05/02/21 1230 05/02/21 1235 05/02/21 1240 05/02/21 1254  BP:    137/86  Pulse:    (!) 108  Resp:      Temp:      TempSrc:      SpO2: 95% 96% 95%   Weight:      Height:        Gen App: NAD, comfortable Abdomen: soft, gravid FHT: baseline 135 bpm.  Accels present.  Decels absent. Moderate variability.   Tocometer: contractions q 1-3 minutes Cervix: 5-6/80/-3 to -2.  Extremities: Nontender, no edema.  Pitocin: 16 mIU  Labs:  No new labs   Assessment:  1: SIUP at [redacted]w[redacted]d 2. Gestational HTN 3. GBS negative  Plan:  1. Continue induction of labor, with Pitocin.   2. GHTN, no severe range BPs noted at this time. Can treat severe range with Procardia.  3. Fetal status reassuring.    Hildred Laser, MD 05/02/2021 1:19 PM

## 2021-05-02 NOTE — Anesthesia Preprocedure Evaluation (Signed)
Anesthesia Evaluation  Patient identified by MRN, date of birth, ID band Patient awake    Reviewed: Allergy & Precautions, NPO status , Patient's Chart, lab work & pertinent test results  History of Anesthesia Complications Negative for: history of anesthetic complications  Airway Mallampati: II  TM Distance: >3 FB Neck ROM: Full    Dental no notable dental hx. (+) Teeth Intact   Pulmonary neg pulmonary ROS, neg sleep apnea, neg COPD, Patient abstained from smoking.Not current smoker,    Pulmonary exam normal breath sounds clear to auscultation       Cardiovascular Exercise Tolerance: Good METShypertension, (-) CAD and (-) Past MI (-) dysrhythmias  Rhythm:Regular Rate:Normal - Systolic murmurs    Neuro/Psych PSYCHIATRIC DISORDERS Anxiety Depression negative neurological ROS     GI/Hepatic neg GERD  ,(+)     (-) substance abuse  ,   Endo/Other  neg diabetes  Renal/GU negative Renal ROS     Musculoskeletal   Abdominal   Peds  Hematology   Anesthesia Other Findings Past Medical History: No date: Abnormal exam of left ear No date: Anxiety No date: Depression  Reproductive/Obstetrics (+) Pregnancy                             Anesthesia Physical Anesthesia Plan  ASA: 2  Anesthesia Plan: Epidural   Post-op Pain Management:    Induction:   PONV Risk Score and Plan: 2 and Treatment may vary due to age or medical condition and Ondansetron  Airway Management Planned: Natural Airway  Additional Equipment:   Intra-op Plan:   Post-operative Plan:   Informed Consent: I have reviewed the patients History and Physical, chart, labs and discussed the procedure including the risks, benefits and alternatives for the proposed anesthesia with the patient or authorized representative who has indicated his/her understanding and acceptance.       Plan Discussed with: Surgeon  Anesthesia  Plan Comments: (Discussed R/B/A of neuraxial anesthesia technique with patient: - rare risks of spinal/epidural hematoma, nerve damage, infection - Risk of PDPH - Risk of itching - Risk of nausea and vomiting - Risk of poor block necessitating replacement of epidural. - Risk of allergic reactions. Patient voiced understanding.)        Anesthesia Quick Evaluation

## 2021-05-02 NOTE — Op Note (Addendum)
Cesarean Section Procedure Note  Indications: failed induction  Pre-operative Diagnosis: 37 week 3 day pregnancy,. Gestational hypertension at term, failed induction of labor.  Post-operative Diagnosis: Same  Surgeon: Hildred Laser, MD  Assistants: Brennan Bailey, MD.  An experienced assistant was required given the standard of surgical care given the complexity of the case.  This assistant was needed for exposure, dissection, suctioning, retraction, instrument exchange, and for overall help during the procedure.  Procedure: Primary low transverse Cesarean Section  Anesthesia: Epidural anesthesia  Procedure Details: The patient was seen in the Holding Room. The risks, benefits, complications, treatment options, and expected outcomes were discussed with the patient.  The patient concurred with the proposed plan, giving informed consent.  The site of surgery properly noted/marked. The patient was taken to the Operating Room, identified as Tasha Foster and the procedure verified as C-Section Delivery. A Time Out was held and the above information confirmed.  After dosing of the patient's epidural was noted to be adequate, the patient was draped and prepped in the usual sterile manner. A Pfannenstiel incision was made and carried down through the subcutaneous tissue to the fascia. Fascial incision was made and extended transversely. The fascia was separated from the underlying rectus tissue superiorly and inferiorly. The peritoneum was identified and entered. Peritoneal incision was extended longitudinally. The surgical assist was able to provide retraction to allow for clear visualization of surgical site. The utero-vesical peritoneal reflection was incised transversely and the bladder flap was bluntly freed from the lower uterine segment. A low transverse uterine incision was made. Delivered from cephalic presentation (ROT position) was a 2940 gram Female with Apgar scores of 7 at one minute and 8 at  five minutes.  The assistant was able to apply adequate fundal pressure to allow for successful delivery of the fetus. After the umbilical cord was clamped and cut, cord blood was obtained for evaluation. The placenta was removed intact and appeared normal. The uterus was exteriorized and cleared of all clots and debris. The uterine outline had mild heart-shaped appearance., Tubes and ovaries appeared normal.  The uterine incision was closed with running locked sutures of 0-Vicryl.  A second suture of 0-Vicryl was used in an imbricating layer. The uterus was noted to be boggy and so uterine massage was initiated.  She was also administered a dose of 1000 mg of tranexamic acid IV.   Hemostasis was observed. The uterus was then returned to the abdomen. The pericolic gutters were cleared of all clots and debris. The fascia was then grasped with Zannie Cove and Tresa Endo clamps, and injected with a total of 60 ml of 1.3% Exparel solution (20 ml of bupivicaine liposomal mixed with 30 ml of 0.5% Marcaine and diluted in 50 ml of normal saline).  The fascia was then reapproximated with a running suture of 0-Vicryl. The subcutaneous fat layer was reapproximated with 2-0 Vicryl. The skin was reapproximated with 4-0 Monocryl. The skin and subcutaneous tissues were then injected with an additional 40 ml of the Exparel solution. The incision was covered with steri-strips and a pressure dressing.   Instrument, sponge, and needle counts were correct prior the abdominal closure and at the conclusion of the case.   Findings: Female infant, cephalic presentation, 2940 grams, with Apgar scores of 7 at one minute and 8 at five minutes. Intact placenta with 3 vessel cord.  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  Tubes and ovaries appeared normal.  Estimated Blood Loss:  815  ml      Drains: foley catheter to gravity drainage, 75 ml of clear urine at end of  the procedure         Total IV Fluids:  250 ml  Specimens: None         Implants: None         Complications:  None; patient tolerated the procedure well.         Disposition: PACU - hemodynamically stable.         Condition: stable   Hildred Laser, MD [facility]

## 2021-05-03 DIAGNOSIS — O165 Unspecified maternal hypertension, complicating the puerperium: Secondary | ICD-10-CM

## 2021-05-03 LAB — CBC
HCT: 22 % — ABNORMAL LOW (ref 36.0–46.0)
Hemoglobin: 7.4 g/dL — ABNORMAL LOW (ref 12.0–15.0)
MCH: 30.3 pg (ref 26.0–34.0)
MCHC: 33.6 g/dL (ref 30.0–36.0)
MCV: 90.2 fL (ref 80.0–100.0)
Platelets: 256 10*3/uL (ref 150–400)
RBC: 2.44 MIL/uL — ABNORMAL LOW (ref 3.87–5.11)
RDW: 12.8 % (ref 11.5–15.5)
WBC: 18.7 10*3/uL — ABNORMAL HIGH (ref 4.0–10.5)
nRBC: 0 % (ref 0.0–0.2)

## 2021-05-03 LAB — HEMOGLOBIN AND HEMATOCRIT, BLOOD
HCT: 22.3 % — ABNORMAL LOW (ref 36.0–46.0)
Hemoglobin: 7.6 g/dL — ABNORMAL LOW (ref 12.0–15.0)

## 2021-05-03 MED ORDER — DIPHENHYDRAMINE HCL 25 MG PO CAPS
25.0000 mg | ORAL_CAPSULE | Freq: Once | ORAL | Status: AC
  Administered 2021-05-03: 25 mg via ORAL
  Filled 2021-05-03: qty 1

## 2021-05-03 MED ORDER — SODIUM CHLORIDE 0.9% IV SOLUTION: Freq: Once | INTRAVENOUS | Status: AC

## 2021-05-03 NOTE — Progress Notes (Signed)
Called Dr. Valentino Saxon regarding Attestation for Consent to Blood Products. Dr. Valentino Saxon stated that Pt. Was instructed in potential for receiving blood products and that Pt. Signed consent at Office visit. Informed Consent was signed by Patient on 02-18-21. This form was printed from Patients chart and placed in [facility] chart. Dr. Valentino Saxon stated she will place an Attestation order.

## 2021-05-03 NOTE — Progress Notes (Signed)
Dr. Valentino Saxon notified that Pt. Heart rate increases to the 150's when assisted up to side of bed and that Pt. C/o feeling "dizzy" while sitting up. I also notified Dr. Valentino Saxon that Pt.'s heart rates have been in the 120's with movement in bed and that Pt. Had 350cc of dark amber urine from Foley Catheter. Dr. Valentino Saxon stated she will put orders in orders for Blood Transfusion.

## 2021-05-03 NOTE — Progress Notes (Signed)
Postpartum Day # 1: Cesarean Delivery, with postpartum hemorrhage. Gestational HTN at term.  Subjective: Patient noted to have postpartum hemorrhage ~ 2 hrs after delivery last night. Was treated with TXA intraoperatively for prophylaxis, and given Cytotec 800 mcg bucally at last bleeding event.  Earlier this morning she is experiencing tachycardia and dizziness with standing.  Pallor noted by nurse.  Just received blood transfusion 1 unit.  She otherwise is tolerating diet. Has not voided on her own as catheter still in place f, but urine output is adequate. Notes that she is feeling better after the transfusion.    Objective: Vital signs in last 24 hours: Temp:  [97.9 F (36.6 C)-99.9 F (37.7 C)] 97.9 F (36.6 C) (08/21 0835) Pulse Rate:  [85-144] 101 (08/21 0835) Resp:  [9-20] 18 (08/21 0835) BP: (98-158)/(58-103) 132/86 (08/21 0835) SpO2:  [92 %-100 %] 99 % (08/21 0900)  Physical Exam:  General: alert and no distress Lungs: clear to auscultation bilaterally Breasts: normal appearance, no masses or tenderness Heart: regular rate and rhythm, S1, S2 normal, no murmur, click, rub or gallop Abdomen: soft, non-tender; bowel sounds normal; no masses,  no organomegaly Pelvis: Lochia appropriate, Uterine Fundus firm, Incision: healing well, no significant drainage, no dehiscence, no significant erythema Extremities: DVT Evaluation: No evidence of DVT seen on physical exam. Negative Homan's sign. No cords or calf tenderness. No significant calf/ankle edema. SCDs in place.    Labs:  CBC Latest Ref Rng & Units 05/03/2021 05/02/2021 05/01/2021  WBC 4.0 - 10.5 K/uL 18.7(H) - 10.8(H)  Hemoglobin 12.0 - 15.0 g/dL 7.4(L) 9.2(L) 11.7(L)  Hematocrit 36.0 - 46.0 % 22.0(L) 26.8(L) 33.4(L)  Platelets 150 - 400 K/uL 256 - 265     Assessment/Plan: Status post Cesarean section. Postoperative course complicated by postpartum hemorrhage.   Breastfeeding, Lactation consult, Contraception  undecided. Regular diet as tolerated Continue PO pain management as needed Remove foley catheter once ambulating  Continue IVF until ambulating well and tolerating full diet.  Is s/p 1 unit PRBCs. Has second unit holding if needed. Repeat H/H 2 hrs after transfusion, due at 10:30 a.m.  Gestational HTN, occasional borderline BPs since delivery.  Will continue to monitor today.  Continue current care.  Hildred Laser, MD [facility]

## 2021-05-03 NOTE — Anesthesia Postprocedure Evaluation (Signed)
Anesthesia Post Note  Patient: Tasha Foster  Procedure(s) Performed: CESAREAN SECTION  Patient location during evaluation: Mother Baby Anesthesia Type: Epidural Level of consciousness: awake and alert Pain management: pain level controlled Vital Signs Assessment: post-procedure vital signs reviewed and stable Respiratory status: spontaneous breathing, nonlabored ventilation and respiratory function stable Cardiovascular status: stable Postop Assessment: no headache, no backache and epidural receding Anesthetic complications: no   No notable events documented.   Last Vitals:  Vitals:   05/03/21 1700 05/03/21 1903  BP: (!) 142/98 (!) 143/93  Pulse: 99 97  Resp: 18 20  Temp: 36.7 C 37.1 C  SpO2: 96% 100%    Last Pain:  Vitals:   05/03/21 1909  TempSrc:   PainSc: 4                  Foye Deer

## 2021-05-03 NOTE — Plan of Care (Signed)
Transferred to Room 339. Alert and oriented with aprop. Affect. Color Pale, sl. Tachycardic, Fundus is firm at u/e with small amount of Lochia. @ <1cm clots noted on Peri pad. Foley to straight drain, draining dark amber urine. Abdominal dressing is D&I. Pt. Denies c/o. Oriented to Room, Safety and Security, Infant Fall Prevention and POC. Pt. V/o. Julianne Handler Rn stated Pt. Had large blood loss PP while in L&D and that Pt. Had passed a large clot. Encouraged Pt. To drink fluids; she has tolerated a sandwich prior to arrival to room 339. Will cont to monitor closely.

## 2021-05-03 NOTE — Progress Notes (Signed)
TOC acknowledges consult due to MOB being positive for THC.  Baby is also in SCN.  Spoke to RN, MOB received blood transfusion today and is not feeling well. Would be best to follow up tomorrow for [facility] assessment. Per RN, MOB will not be discharging for a couple of days. Will leave handoff for TOC to follow up tomorrow.  Alfonso Ramus, [place] [phone]

## 2021-05-03 NOTE — Plan of Care (Signed)
PP

## 2021-05-04 ENCOUNTER — Encounter: Payer: Self-pay | Admitting: Obstetrics and Gynecology

## 2021-05-04 NOTE — Discharge Instructions (Signed)
Discharge Instructions:  ° °Follow-up Appointment:  ° °If there are any new medications, they have been ordered and will be available for pickup at the listed pharmacy on your way home from the hospital.  ° °Call office if you have any of the following: headache, visual changes, fever >101.0 F, chills, shortness of breath, breast concerns, excessive vaginal bleeding, incision drainage or problems, leg pain or redness, depression or any other concerns. If you have vaginal discharge with an odor, let your doctor know.  ° °It is normal to bleed for up to 6 weeks. You should not soak through more than 1 pad in 1 hour. If you have a blood clot larger than your fist with continued bleeding, call your doctor.  ° °After a c-section, you should expect a small amount of blood or clear fluid coming from the incision and abdominal cramping/soreness. Inspect your incision site daily. Stand in front of a mirror to look for any redness, incision opening, or discolored/odorness drainage. Take a shower daily and continue good hygiene. Use own towel and washcloth (do not share). Make sure your sheets on your bed are clean. No pets sleeping around your incision site. Dressing will be removed at your postpartum visit. If the dressing does become wet or soiled underneath, it is okay to remove it.  ° °Activity: Do not lift > 10 lbs for 6 weeks (do not lift anything heavier than your baby). °No intercourse, tampons, swimming pools, hot tubs, baths (only showers) for 6 weeks.  °No driving for 1-2 weeks. °Continue prenatal vitamin, especially if breastfeeding. °Increase calories and fluids (water) while breastfeeding.  ° °Your milk will come in, in the next couple of days (right now it is colostrum). You may have a slight fever when your milk comes in, but it should go away on its own.  If it does not, and rises above 101 F please call the doctor. You will also feel achy and your breasts will be firm. They will also start to leak. If you  are breastfeeding, continue as you have been and you can pump/express milk for comfort.  ° °If you have too much milk, your breasts can become engorged, which could lead to mastitis. This is an infection of the milk ducts. It can be very painful and you will need to notify your doctor to obtain a prescription for antibiotics. You can also treat it with a shower or hot/cold compress.  ° °For concerns about your baby, please call your pediatrician.  °For breastfeeding concerns, the lactation consultant can be reached at [phone].  ° °Postpartum blues (feelings of happy one minute and sad another minute) are normal for the first few weeks but if it gets worse let your doctor know.  ° °Congratulations! We enjoyed caring for you and your new bundle of joy!  °

## 2021-05-04 NOTE — Progress Notes (Signed)
Postpartum Day # 2: Cesarean Delivery, postpartum hemorhage, and GHTN  Subjective: Patient reports tolerating PO, + flatus, and no problems voiding.  Pain is well controlled with medications.  Does note passing some smaller blood clots, but only when using the restroom.  Is ambulating currently to visit her baby in the nursery.   Objective: Vitals:   05/03/21 2109 05/03/21 2340 05/04/21 0259 05/04/21 0723  BP: 125/77 121/71 133/90 (!) 140/100  Pulse: 97 96 100 97  Resp: 18 18 20 18   Temp: 98.3 F (36.8 C) 98.4 F (36.9 C) 98.4 F (36.9 C) 98.7 F (37.1 C)  TempSrc: Oral Oral Oral Oral  SpO2: 99% 97% 100% 100%  Weight:      Height:         Physical Exam:  General: alert and no distress Lungs: clear to auscultation bilaterally Breasts: normal appearance, no masses or tenderness Heart: regular rate and rhythm, S1, S2 normal, no murmur, click, rub or gallop Abdomen: soft, non-tender; bowel sounds normal; no masses,  no organomegaly Pelvis: Lochia appropriate, Uterine Fundus firm, Incision: healing well, no significant drainage, no dehiscence, no significant erythema Extremities: DVT Evaluation: No evidence of DVT seen on physical exam. Negative Homan's sign. No cords or calf tenderness. No significant calf/ankle edema.   Labs: CBC Latest Ref Rng & Units 05/03/2021 05/03/2021 05/02/2021  WBC 4.0 - 10.5 K/uL - 18.7(H) -  Hemoglobin 12.0 - 15.0 g/dL 7.6(L) 7.4(L) 9.2(L)  Hematocrit 36.0 - 46.0 % 22.3(L) 22.0(L) 26.8(L)  Platelets 150 - 400 K/uL - 256 -    Assessment/Plan: Status post Cesarean section. Doing well postoperatively now. Immediate postpartum course complicated by postpartum hemorrhage. S/p TXA, Cytotec, and 1 unit PRBCs.  Breastfeeding, and Lactation consult Regular diet Continue PO pain management Continue to monitor BPs.  Most recent one noted to be elevated. Has had several borderline BPs yesterday. Will likely need treatment postpartum.  Contraception:  undecided. Continue current care. Plan for discharge tomorrow.  05/04/2021, MD [facility]

## 2021-05-04 NOTE — TOC Progression Note (Signed)
Transition of Care [facility]) - Progression Note    Patient Details  Name: Tasha Foster MRN: [id number] Date of Birth: [birth year]  [facility] CM/SW Contact  [place]ville Cellar, RN Phone Number: 05/04/2021, 1:35 PM  Clinical Narrative:     Spoke with patient regarding needs/concerns. Patient is appropriate and reports being happy and content. Patient will be [facility] with infant and planning to breastfeed solely. Engaged with [facility] services and has all needed supplies. Patient spoke with [facility] this morning and updated of delivery. Discussed PPD and s/s including resources if needed. Patient reports she had some history of depression/anxiety however has been so happy since becoming pregnant. Has strong support system including family and husband/FOB. Reports infant is latching well and continues working with LC. Patient reports she was doing Marijuana early in the pregnancy but did stop after talking with her OB. Patient is engaged with her therapist and will follow up with therapy after discharge as well. Pediatrician has been selected and patient reports instructed to schedule follow up appointment after discharge. Patient hopeful infant will return to mothers room tonight if no other medical needs identified. Eager for discharge home with significant other. No other needs or concerns. Answered all questions and provided TOC number if needed for follow up.        Expected Discharge Plan and Services                                                 Social Determinants of Health (SDOH) Interventions    Readmission Risk Interventions No flowsheet data found.

## 2021-05-05 ENCOUNTER — Other Ambulatory Visit: Payer: Self-pay

## 2021-05-05 LAB — TYPE AND SCREEN
ABO/RH(D): O POS
Antibody Screen: NEGATIVE
Unit division: 0
Unit division: 0

## 2021-05-05 LAB — BPAM RBC
Blood Product Expiration Date: 202209242359
Blood Product Expiration Date: 202209242359
ISSUE DATE / TIME: 202208210613
Unit Type and Rh: 5100
Unit Type and Rh: 5100

## 2021-05-05 MED ORDER — IBUPROFEN 800 MG PO TABS
800.0000 mg | ORAL_TABLET | Freq: Three times a day (TID) | ORAL | 1 refills | Status: DC | PRN
  Filled 2021-05-05: qty 30, 10d supply, fill #0
  Filled 2021-05-15 (×2): qty 30, 10d supply, fill #1

## 2021-05-05 MED ORDER — DOCUSATE SODIUM 100 MG PO CAPS
100.0000 mg | ORAL_CAPSULE | Freq: Two times a day (BID) | ORAL | 2 refills | Status: DC | PRN
  Filled 2021-05-05 (×2): qty 30, 15d supply, fill #0

## 2021-05-05 MED ORDER — LABETALOL HCL 200 MG PO TABS
200.0000 mg | ORAL_TABLET | Freq: Two times a day (BID) | ORAL | 1 refills | Status: DC
  Filled 2021-05-05 (×2): qty 60, 30d supply, fill #0

## 2021-05-05 MED ORDER — FERROUS SULFATE 325 (65 FE) MG PO TABS
325.0000 mg | ORAL_TABLET | Freq: Three times a day (TID) | ORAL | 1 refills | Status: DC
  Filled 2021-05-05: qty 90, 30d supply, fill #0

## 2021-05-05 MED ORDER — LABETALOL HCL 200 MG PO TABS
200.0000 mg | ORAL_TABLET | Freq: Once | ORAL | Status: AC
  Administered 2021-05-05: 200 mg via ORAL
  Filled 2021-05-05: qty 1

## 2021-05-05 MED ORDER — TRAMADOL HCL 50 MG PO TABS
50.0000 mg | ORAL_TABLET | Freq: Four times a day (QID) | ORAL | 0 refills | Status: DC | PRN
  Filled 2021-05-05: qty 20, 5d supply, fill #0

## 2021-05-05 MED ORDER — ACETAMINOPHEN 500 MG PO TABS: 1000.0000 mg | ORAL_TABLET | Freq: Four times a day (QID) | ORAL | 0 refills | Status: DC | PRN

## 2021-05-05 NOTE — Progress Notes (Signed)
Pt discharged, instructions reviewed and pt verbalized understanding. IV d/c'd with catheter intact. Pt will be rooming in with baby who is still under care of [facility]atology. Pharmacy notified of pt's admission to meds to beds program. Pharmacy tech stated pharmacist will be up to counsel pt and obtain insurance information.

## 2021-05-05 NOTE — Discharge Summary (Signed)
Postpartum Discharge Summary     Patient Name: Tasha Foster DOB: 03/25/1996 MRN: [id number]  Date of admission: 05/01/2021 Delivery date:05/02/2021  Delivering provider: Rubie Maid  Date of discharge: 05/05/2021  Admitting diagnosis: Gestational hypertension [O13.9] Intrauterine pregnancy: [redacted]w[redacted]d    Secondary diagnosis:  Active Problems:   Gestational hypertension  Additional problems: Marijuana use in pregnancy, history of anxiety and depression   Discharge diagnosis: Term Pregnancy Delivered, Gestational Hypertension, Anemia, and PPH                                              Post partum procedures:blood transfusion (1 unit) Augmentation: AROM, Pitocin, Cytotec, and IP Foley Complications: [id number] Hospital course: Induction of Labor With Cesarean Section   25y.o. yo G1P1001 at 352w3das admitted to the hospital 05/01/2021 for induction of labor secondary to gestational hypertension. Patient had a labor course significant for failed induction of labor (arrest at 5-6 cm, no fetal descent). The patient went for cesarean section due to  failed induction . Delivery details are as follows: Membrane Rupture Time/Date: 9:07 AM ,05/02/2021   Delivery Method:C-Section, Low Transverse  Details of operation can be found in separate operative Note.  Patient had a postpartum course that was complicated postpartum hemorrhage. She received treatment with IV Tranexamic acid intraoperatively, Cytotec 2 hours post-operatively and blood transfusion 1 unit on POD#1 for symptomatic anemia. She is ambulating, tolerating a regular diet, passing flatus, and urinating well.  Patient is discharged home in stable condition on 05/05/21.      Newborn Data: Birth date:05/02/2021  Birth time:6:26 PM  Gender:Female  Living status:Living  Apgars:7 ,8  Weight:2940 g                                Magnesium Sulfate received: No BMZ received: No Rhophylac:N/A MMR:No T-DaP:Given  prenatally Flu: No Transfusion:Yes  Physical exam  Vitals:   05/04/21 2009 05/04/21 2314 05/05/21 0320 05/05/21 0823  BP: (!) 145/87 (!) 141/86 133/89 135/85  Pulse: (!) 102 100 91 88  Resp: _0 Temp: 98.5 F (36.9 C) 98.3 F (36.8 C) 97.8 F (36.6 C) 98.6 F (37 C)  TempSrc: Oral Oral Oral Oral  SpO2: 99% 100% 99% 100%  Weight:      Height:       General: alert, cooperative, and no distress Lochia: appropriate Uterine Fundus: firm Incision: Healing well with no significant drainage, No significant erythema, Dressing is clean, dry, and intact DVT Evaluation: No evidence of DVT seen on physical exam. Negative Homan's sign. No cords or calf tenderness. No significant calf/ankle edema. Calf/Ankle edema is present   Labs: Lab Results  Component Value Date   WBC 18.7 (H) 05/03/2021   HGB 7.6 (L) 05/03/2021   HCT 22.3 (L) 05/03/2021   MCV 90.2 05/03/2021   PLT 256 05/03/2021   CMP Latest Ref Rng & Units 05/01/2021  Glucose 70 - 99 mg/dL 81  BUN 6 - 20 mg/dL <5(L)  Creatinine 0.44 - 1.00 mg/dL 0.46  Sodium 135 - 145 mmol/L 134(L)  Potassium 3.5 - 5.1 mmol/L 3.6  Chloride 98 - 111 mmol/L 103  CO2 22 - 32 mmol/L 21(L)  Calcium 8.9 - 10.3 mg/dL 8.6(L)  Total Protein 6.5 - 8.1 g/dL 6.6  Total  Bilirubin 0.3 - 1.2 mg/dL 0.5  Alkaline Phos 38 - 126 U/L 106  AST 15 - 41 U/L 18  ALT 0 - 44 U/L 17    Results for orders placed or performed during the hospital encounter of 05/01/21  Protein / creatinine ratio, urine  Result Value Ref Range   Creatinine, Urine 131 mg/dL   Total Protein, Urine 34 mg/dL   Protein Creatinine Ratio 0.26 (H) 0.00 - 0.15 mg/mg[Cre]    Edinburgh Score: Edinburgh Postnatal Depression Scale Screening Tool 05/03/2021  I have been able to laugh and see the funny side of things. 0  I have looked forward with enjoyment to things. 0  I have blamed myself unnecessarily when things went wrong. 0  I have been anxious or worried for no good  reason. 0  I have felt scared or panicky for no good reason. 0  Things have been getting on top of me. 0  I have been so unhappy that I have had difficulty sleeping. 0  I have felt sad or miserable. 0  I have been so unhappy that I have been crying. 0  The thought of harming myself has occurred to me. 0  Edinburgh Postnatal Depression Scale Total 0      After visit meds:  Allergies as of 05/05/2021       Reactions   Amoxicillin Hives, Swelling   Of throat TOLERATED CEFAZOLIN   Penicillins    TOLERATED CEFAZOLIN        Medication List     STOP taking these medications    pantoprazole 20 MG tablet Commonly known as: PROTONIX       TAKE these medications    acetaminophen 500 MG tablet Commonly known as: TYLENOL Take 2 tablets (1,000 mg total) by mouth every 6 (six) hours as needed (alternate with Ibuprofen every 3-4 hours as needed).   docusate sodium 100 MG capsule Commonly known as: COLACE Take 1 capsule (100 mg total) by mouth 2 (two) times daily as needed.   ferrous sulfate 325 (65 FE) MG tablet Take 1 tablet (325 mg total) by mouth 3 (three) times daily with meals.   ibuprofen 800 MG tablet Commonly known as: ADVIL Take 1 tablet (800 mg total) by mouth every 8 (eight) hours as needed for mild pain, moderate pain or cramping.   labetalol 200 MG tablet Commonly known as: NORMODYNE Take 1 tablet (200 mg total) by mouth 2 (two) times daily.   multivitamin-prenatal 27-0.8 MG Tabs tablet Take 1 tablet by mouth daily at 12 noon.   traMADol 50 MG tablet Commonly known as: Ultram Take 1 tablet (50 mg total) by mouth every 6 (six) hours as needed.         Discharge home in stable condition Infant Feeding: Breast Infant Disposition:rooming in Discharge instruction: per After Visit Summary and Postpartum booklet. Activity: Advance as tolerated. Pelvic rest for 6 weeks.  Diet: routine diet Anticipated Birth Control: IUD Postpartum Appointment:6  weeks Additional Postpartum F/U: Postpartum Depression checkup, Incision check 1 week, and BP check 1 week Future Appointments: Future Appointments  Date Time Provider [place]  05/07/2021 11:30 AM Rubie Maid, MD [facility] None  06/16/2021  1:30 PM Rubie Maid, MD [facility] None   Follow up Visit:  Follow-up Information     Rubie Maid, MD Follow up.   Specialties: Obstetrics and Gynecology, Radiology Why: 1 week for BP and incision check (already scheduled).  6 week postpartum visit Contact information: [address] [place] [address] [phone]                     05/05/2021 Rubie Maid, MD

## 2021-05-05 NOTE — Progress Notes (Signed)
Postpartum Day # 3: Cesarean Delivery, postpartum hemorhage, and GHTN  Subjective: Patient reports tolerating PO, + flatus, and no problems voiding.  Pain is well controlled with medications.  Does note passing some smaller blood clots, but only when using the restroom.  Is ambulating currently to visit her baby in the nursery.   Objective: Vitals:   05/04/21 2009 05/04/21 2314 05/05/21 0320 05/05/21 0823  BP: (!) 145/87 (!) 141/86 133/89 135/85  Pulse: (!) 102 100 91 88  Resp: 20 18 20 20   Temp: 98.5 F (36.9 C) 98.3 F (36.8 C) 97.8 F (36.6 C) 98.6 F (37 C)  TempSrc: Oral Oral Oral Oral  SpO2: 99% 100% 99% 100%  Weight:      Height:         Physical Exam:  General: alert and no distress Lungs: clear to auscultation bilaterally Breasts: normal appearance, no masses or tenderness Heart: regular rate and rhythm, S1, S2 normal, no murmur, click, rub or gallop Abdomen: soft, non-tender; bowel sounds normal; no masses,  no organomegaly Pelvis: Lochia appropriate, Uterine Fundus firm, Incision: healing well, no significant drainage, no dehiscence, no significant erythema Extremities: DVT Evaluation: No evidence of DVT seen on physical exam. Negative Homan's sign. No cords or calf tenderness. No significant calf/ankle edema.   Labs: CBC Latest Ref Rng & Units 05/03/2021 05/03/2021 05/02/2021  WBC 4.0 - 10.5 K/uL - 18.7(H) -  Hemoglobin 12.0 - 15.0 g/dL 7.6(L) 7.4(L) 9.2(L)  Hematocrit 36.0 - 46.0 % 22.3(L) 22.0(L) 26.8(L)  Platelets 150 - 400 K/uL - 256 -    Assessment/Plan: Status post Cesarean section. Doing well postoperatively.  Immediate postpartum course complicated by postpartum hemorrhage. S/p TXA, Cytotec, and 1 unit PRBCs on POD#1. Continue PO iron supplements. Breastfeeding, is going well.  Regular diet Continue PO pain management BPs labile, however will proceed with starting BP meds. Prescribed Labetalol 200 mg BID. To continue for 6 weeks.  Contraception: is now  considering IUD.  Will place at or after postpartum visit. Continue current care. Plan for discharge today. Will room in with infant who is still undergoing monitoring for jaundice. To f/u in 1 week for BP check and incision check.    05/04/2021, MD [facility]

## 2021-05-05 NOTE — Lactation Note (Signed)
This note was copied from a baby's chart. Lactation Consultation Note  Patient Name: Tasha Foster Today's Date: 05/05/2021 Reason for consult: Initial assessment;Primapara;Early term 37-38.6wks Age:26 hours  Initial visit by [facility]. Baby born to P1 mom at [redacted]w[redacted]d spent time in SCN and has been rooming-in with parents since yesterday.  Mom began pumping and is providing EBM as supplement to feedings at the breast. Mom and baby are doing well w/ positioning, latching w/ nipple shield, and giving supplement. During visit this morning parents were encouraged to continue pumping at a minimum of 4x/day, feeding on demand at the breast, supplementing as needed w/ EBM post feedings. Reviewed tips for keeping baby awake and alert throughout feeding, signs of adequate intake, transitional and mature milk stool colors, and output expectations.   Education given for anticipated breast changes, breast fullness and engorgement and management of both, nipple care, and guidance for breast care with pumping and use of nipple shield.  Information given for outpatient lactation services and community breastfeeding support. Encouraged to call w/ questions and for possible observed feeding today before potential discharge.  Maternal Data Has patient been taught Hand Expression?: Yes Does the patient have breastfeeding experience prior to this delivery?: No  Feeding Mother's Current Feeding Choice: Breast Milk  LATCH Score                    Lactation Tools Discussed/Used Tools: Pump;Nipple Shields Breast pump type: Double-Electric Breast Pump Reason for Pumping: SCN initially Pumping frequency: q 3-4 hours  Interventions Interventions: Breast feeding basics reviewed;Hand express;DEBP;Education  Discharge Pump: Personal  Consult Status Consult Status: Follow-up Date: 05/05/21 Follow-up type: Call as needed    Danford Bad 05/05/2021, 9:29 AM

## 2021-05-06 ENCOUNTER — Encounter: Payer: Medicaid Other | Admitting: Obstetrics and Gynecology

## 2021-05-06 LAB — PREPARE RBC (CROSSMATCH)

## 2021-05-07 ENCOUNTER — Encounter: Payer: Medicaid Other | Admitting: Obstetrics and Gynecology

## 2021-05-11 ENCOUNTER — Other Ambulatory Visit: Payer: Self-pay

## 2021-05-11 ENCOUNTER — Encounter: Payer: Self-pay | Admitting: Obstetrics and Gynecology

## 2021-05-11 ENCOUNTER — Ambulatory Visit (INDEPENDENT_AMBULATORY_CARE_PROVIDER_SITE_OTHER): Payer: Medicaid Other | Admitting: Obstetrics and Gynecology

## 2021-05-11 VITALS — BP 138/85 | HR 88 | Resp 16 | Ht 60.0 in | Wt 154.5 lb

## 2021-05-11 DIAGNOSIS — Z9289 Personal history of other medical treatment: Secondary | ICD-10-CM

## 2021-05-11 DIAGNOSIS — O139 Gestational [pregnancy-induced] hypertension without significant proteinuria, unspecified trimester: Secondary | ICD-10-CM

## 2021-05-11 DIAGNOSIS — Z98891 History of uterine scar from previous surgery: Secondary | ICD-10-CM

## 2021-05-11 DIAGNOSIS — Z5189 Encounter for other specified aftercare: Secondary | ICD-10-CM

## 2021-05-11 NOTE — Progress Notes (Signed)
    GYNECOLOGY PROGRESS NOTE  Subjective:    Patient ID: Tasha Foster, female    DOB: [birth year], 25 y.o.   MRN: [id number]  HPI  Patient is a 25 y.o. G44P1001 female who presents for incision check. She had a low transverse cesarean section on 05/02/2021. I have reviewed her pregnancy and postpartum course. Patient had IOL for gestational HTN at term at [redacted] weeks gestation. Cesarean section performed for failed induction. Postpartum course significant for postpartum hemorrhage requiring blood transfusion (1 unit PRBCs).  She reports that incision is healing well. Pregnancy was complicated by gestational HTN at term. She denies bleeding, swelling and drainage. Her pain is well controlled with Tylenol. She has been moving around a lot to help with healing. Bowel and bladder function are normal. [name] is taking her Labetalol as prescribed for her blood pressures.  Mood is otherwise well.  She has no concerns.  The following portions of the patient's history were reviewed and updated as appropriate: allergies, current medications, past family history, past medical history, past social history, past surgical history, and problem list.  Review of Systems Pertinent items noted in HPI and remainder of comprehensive ROS otherwise negative.   Objective:  Blood pressure 138/85, pulse 88, resp. rate 16, height 5' (1.524 m), weight 154 lb 8 oz (70.1 kg), last menstrual period 08/07/2020, currently breastfeeding. Body mass index is 30.17 kg/m. General appearance: alert and no distress Abdomen: soft, non-tender; bowel sounds normal; no masses,  no organomegaly.  Incision site with honeycomb dressing.  Dressing removed to reveal well-healing incision, well-approximated, no erythema or drainage. Pelvic: deferred Extremities: extremities normal, atraumatic, no cyanosis or edema Neurologic: Grossly normal   Labs:  Lab Results  Component Value Date   WBC 18.7 (H) 05/03/2021   HGB 7.6 (L) 05/03/2021   HCT  22.3 (L) 05/03/2021   MCV 90.2 05/03/2021   PLT 256 05/03/2021     Assessment:   1. Visit for wound check   2. S/P cesarean section   3. Gestational hypertension, antepartum   4. Other immediate postpartum hemorrhage   5. History of blood transfusion      Plan:   S/p cesarean section, healing well. Advised on care of her incision. Can remove steri-strips in 3-5 days.  Gestational HTN, currently on Labetalol. Advised to continue medication until postpartum visit in 5 weeks. Denies any symptoms or adverse side effects at this time.  Postpartum hemorrhage s/p blood transfusion 1 unit PRBCs. Patient denies any symptoms of anemia at this time. Is taking iron supplements. Will recheck levels at final postpartum visit.     Hildred Laser, MD [facility].

## 2021-05-14 ENCOUNTER — Other Ambulatory Visit: Payer: Self-pay | Admitting: Obstetrics and Gynecology

## 2021-05-14 MED ORDER — TRAMADOL HCL 50 MG PO TABS: 50.0000 mg | ORAL_TABLET | Freq: Four times a day (QID) | ORAL | 0 refills | Status: DC | PRN

## 2021-05-15 ENCOUNTER — Other Ambulatory Visit: Payer: Self-pay

## 2021-05-25 ENCOUNTER — Other Ambulatory Visit: Payer: Self-pay

## 2021-05-26 ENCOUNTER — Other Ambulatory Visit: Payer: Self-pay

## 2021-05-26 MED ORDER — SERTRALINE HCL 50 MG PO TABS: 50.0000 mg | ORAL_TABLET | Freq: Every day | ORAL | 0 refills | Status: DC

## 2021-05-27 ENCOUNTER — Other Ambulatory Visit: Payer: Self-pay | Admitting: Obstetrics and Gynecology

## 2021-05-28 ENCOUNTER — Telehealth: Payer: Self-pay

## 2021-05-28 NOTE — Telephone Encounter (Signed)
Pt sent a mychart message yesterday stating that the pharmacy stated that she couldn't pick up her medication until Friday. Spoke to [place] at [employer] was informed that her insurance was declining the medication stating that the dose was too high and needed to be adjusted for coverage. Loraine Leriche stated that I could do a PA and it may cover it but was unsure. Loraine Leriche stated that the dose was appropriate and unsure why the insurance is not covering it. Number to insurance [phone]. Pt is aware via mychart that we are working on getting her medication.

## 2021-06-02 MED ORDER — SERTRALINE HCL 50 MG PO TABS: 50.0000 mg | ORAL_TABLET | Freq: Every day | ORAL | 3 refills | Status: DC

## 2021-06-15 NOTE — Progress Notes (Deleted)
   OBSTETRICS POSTPARTUM CLINIC PROGRESS NOTE  Subjective:     Mayana Irigoyen is a 25 y.o. G54P1001 female who presents for a postpartum visit. She is 6 week postpartum following a low cervical transverse Cesarean section. I have fully reviewed the prenatal and intrapartum course. The delivery was at 37 gestational weeks.  Anesthesia: epidural. Postpartum course has been ***. Baby's course has been ***. Baby is feeding by {breast/bottle:69}. Bleeding: patient {HAS HAS ATF:57322} not resumed menses, with Patient's last menstrual period was 08/07/2020.Marland Kitchen Bowel function is {normal:32111}. Bladder function is {normal:32111}. Patient {is/is not:9024} sexually active. Contraception method desired is {contraceptive method:5051}. Postpartum depression screening: {neg default:13464::"negative"}.  EDPS score is ***.    The following portions of the patient's history were reviewed and updated as appropriate: allergies, current medications, past family history, past medical history, past social history, past surgical history, and problem list.  Review of Systems {ros; complete:30496}   Objective:    LMP 08/07/2020   General:  alert and no distress   Breasts:  inspection negative, no nipple discharge or bleeding, no masses or nodularity palpable  Lungs: clear to auscultation bilaterally  Heart:  regular rate and rhythm, S1, S2 normal, no murmur, click, rub or gallop  Abdomen: soft, non-tender; bowel sounds normal; no masses,  no organomegaly.  ***Well healed Pfannenstiel incision   Vulva:  normal  Vagina: normal vagina, no discharge, exudate, lesion, or erythema  Cervix:  no cervical motion tenderness and no lesions  Corpus: normal size, contour, position, consistency, mobility, non-tender  Adnexa:  normal adnexa and no mass, fullness, tenderness  Rectal Exam: Not performed.         Labs:  Lab Results  Component Value Date   HGB 7.6 (L) 05/03/2021     Assessment:   No diagnosis found.    Plan:    1. Contraception: {method:5051} 2. Will check Hgb for h/o postpartum anemia of less than 10.  3. Follow up in: 6 months or as needed.    Tommie Raymond, CMA [facility]

## 2021-06-16 ENCOUNTER — Encounter: Payer: Medicaid Other | Admitting: Obstetrics and Gynecology

## 2021-07-07 ENCOUNTER — Other Ambulatory Visit: Payer: Self-pay

## 2021-07-07 ENCOUNTER — Encounter: Payer: Medicaid Other | Admitting: Obstetrics and Gynecology

## 2021-07-07 NOTE — Progress Notes (Deleted)
   OBSTETRICS POSTPARTUM CLINIC PROGRESS NOTE  Subjective:     Glada Wickstrom is a 25 y.o. G101P1001 female who presents for a postpartum visit. She is 6 week postpartum following a low cervical transverse Cesarean section. I have fully reviewed the prenatal and intrapartum course. The delivery was at 37 gestational weeks.  Anesthesia: epidural. Postpartum course has been ***. Baby's course has been ***. Baby is feeding by {breast/bottle:69}. Bleeding: patient {HAS HAS ZOX:09604} not resumed menses, with No LMP recorded.. Bowel function is {normal:32111}. Bladder function is {normal:32111}. Patient {is/is not:9024} sexually active. Contraception method desired is {contraceptive method:5051}. Postpartum depression screening: {neg default:13464::"negative"}.  EDPS score is ***.    The following portions of the patient's history were reviewed and updated as appropriate: allergies, current medications, past family history, past medical history, past social history, past surgical history, and problem list.  Review of Systems {ros; complete:30496}   Objective:    There were no vitals taken for this visit.  General:  alert and no distress   Breasts:  inspection negative, no nipple discharge or bleeding, no masses or nodularity palpable  Lungs: clear to auscultation bilaterally  Heart:  regular rate and rhythm, S1, S2 normal, no murmur, click, rub or gallop  Abdomen: soft, non-tender; bowel sounds normal; no masses,  no organomegaly.  ***Well healed Pfannenstiel incision   Vulva:  normal  Vagina: normal vagina, no discharge, exudate, lesion, or erythema  Cervix:  no cervical motion tenderness and no lesions  Corpus: normal size, contour, position, consistency, mobility, non-tender  Adnexa:  normal adnexa and no mass, fullness, tenderness  Rectal Exam: Not performed.         Labs:  Lab Results  Component Value Date   HGB 7.6 (L) 05/03/2021     Assessment:   No diagnosis found.   Plan:     1. Contraception: {method:5051} 2. Will check Hgb for h/o postpartum anemia of less than 10.  3. Follow up in: {1-10:13787} {time; units:19136} or as needed.    Tommie Raymond, CMA [facility]

## 2021-07-08 ENCOUNTER — Telehealth: Payer: Self-pay | Admitting: Obstetrics and Gynecology

## 2021-07-08 ENCOUNTER — Encounter: Payer: Medicaid Other | Admitting: Obstetrics and Gynecology

## 2021-07-08 NOTE — Telephone Encounter (Signed)
Pt is scheduled for an IUD placement 11-18 she is asking if she can be placed on an oral birth control in the meantime, she is using condoms but wanting a back up preventative. Confirmed pharmacy as [facility] [address]. Please Advise.

## 2021-07-09 NOTE — Telephone Encounter (Signed)
Please advise. Thanks Jaleeyah Munce 

## 2021-07-09 NOTE — Telephone Encounter (Signed)
Pt called no answer LM voicemail that I was calling in reply to her call to the office. Informed pt that I had information from [facility] and needed to speak to her. Pt was advise to please contact the office as soon as possible. Sent pt a [employer].

## 2021-07-31 ENCOUNTER — Other Ambulatory Visit: Payer: Self-pay

## 2021-07-31 ENCOUNTER — Encounter: Payer: Self-pay | Admitting: Obstetrics and Gynecology

## 2021-07-31 ENCOUNTER — Ambulatory Visit (INDEPENDENT_AMBULATORY_CARE_PROVIDER_SITE_OTHER): Payer: Medicaid Other | Admitting: Obstetrics and Gynecology

## 2021-07-31 DIAGNOSIS — Z30430 Encounter for insertion of intrauterine contraceptive device: Secondary | ICD-10-CM

## 2021-07-31 DIAGNOSIS — Z9289 Personal history of other medical treatment: Secondary | ICD-10-CM

## 2021-07-31 DIAGNOSIS — Z30018 Encounter for initial prescription of other contraceptives: Secondary | ICD-10-CM

## 2021-07-31 DIAGNOSIS — Z8759 Personal history of other complications of pregnancy, childbirth and the puerperium: Secondary | ICD-10-CM | POA: Diagnosis not present

## 2021-07-31 LAB — POCT URINE PREGNANCY: Preg Test, Ur: NEGATIVE

## 2021-07-31 NOTE — Patient Instructions (Signed)

## 2021-07-31 NOTE — Progress Notes (Signed)
OBSTETRICS POSTPARTUM CLINIC PROGRESS NOTE  Subjective:     Tasha Foster is a 25 y.o. G35P1001 female who presents for a postpartum visit. She is  12  weeks postpartum following a low cervical transverse Cesarean section. I have fully reviewed the prenatal and intrapartum course. The delivery was at 37 gestational weeks, induction of labor for GHTN at term.  Anesthesia: epidural and spinal. She received a blood transfusion postpartum due to symptomatic anemia. Postpartum course has been well. Baby's course has been well. Baby is feeding by  formula . Bleeding: patient has not resumed menses, with No LMP recorded.. Bowel function is normal. Bladder function is normal. Patient is not sexually active. Contraception method desired is IUD. Postpartum depression screening: negative.  EDPS score is 0.    The following portions of the patient's history were reviewed and updated as appropriate: allergies, current medications, past family history, past medical history, past social history, past surgical history, and problem list.  Review of Systems Pertinent items noted in HPI and remainder of comprehensive ROS otherwise negative.   Objective:    BP (!) 121/91   Pulse 86   Ht 5' (1.524 m)   Wt 146 lb 1.6 oz (66.3 kg)   LMP 07/20/2021 (Approximate)   Breastfeeding No   BMI 28.53 kg/m   General:  alert and no distress   Breasts:  inspection negative, no nipple discharge or bleeding, no masses or nodularity palpable  Lungs: clear to auscultation bilaterally  Heart:  regular rate and rhythm, S1, S2 normal, no murmur, click, rub or gallop  Abdomen: soft, non-tender; bowel sounds normal; no masses,  no organomegaly.  Well healed Pfannenstiel incision   Vulva:  normal  Vagina: normal vagina, no discharge, exudate, lesion, or erythema  Cervix:  no cervical motion tenderness and no lesions  Corpus: normal size, contour, position, consistency, mobility, non-tender  Adnexa:  normal adnexa and no mass,  fullness, tenderness  Rectal Exam: Not performed.         Labs:  Lab Results  Component Value Date   HGB 7.6 (L) 05/03/2021    Assessment:   1. Postpartum care following cesarean delivery   2. Encounter for insertion of intrauterine contraceptive device (IUD)   3. History of blood transfusion   4. History of gestational hypertension      Plan:   1. Contraception: IUD (see procedure note below) 2. Will check Hgb for h/o postpartum anemia of less than 10. Received blood transfusion postpartum.  3. History of GHTN, BPs overall return to normal.  4. Follow up in: 3-4 months for annual exam.  To follow up I 4 weeks for IUD thread check.     IUD Insertion Procedure Note Patient identified, informed consent performed, consent signed.   Discussed risks of irregular bleeding, cramping, infection, malpositioning or misplacement of the IUD outside the uterus which may require further procedure such as laparoscopy. Also discussed >99% contraception efficacy, increased risk of ectopic pregnancy with failure of method.   Emphasized that this did not protect against STIs, condoms recommended during all sexual encounters. Time out was performed.  Urine pregnancy test negative.  Speculum placed in the vagina.  Cervix visualized.  Cleaned with Betadine x 2.  Grasped anteriorly with a single tooth tenaculum.  Uterus sounded to 6 cm.  Mirena IUD placed per manufacturer's recommendations.  Strings trimmed to 3 cm. Tenaculum was removed, good hemostasis noted.  Patient tolerated procedure well.   Patient was given post-procedure instructions.  She  was advised to have backup contraception for one week.  Patient was also asked to check IUD strings periodically and follow up in 4 weeks for IUD check.   Lot: [id number] Exp: 2024/Nov   Hildred Laser, MD [facility]

## 2021-08-01 LAB — HEMOGLOBIN AND HEMATOCRIT, BLOOD
Hematocrit: 40.4 % (ref 34.0–46.6)
Hemoglobin: 12.3 g/dL (ref 11.1–15.9)

## 2021-08-03 ENCOUNTER — Encounter: Payer: Self-pay | Admitting: Obstetrics and Gynecology

## 2021-08-18 ENCOUNTER — Encounter: Payer: Self-pay | Admitting: Obstetrics and Gynecology

## 2021-08-31 ENCOUNTER — Encounter: Payer: Medicaid Other | Admitting: Obstetrics and Gynecology

## 2021-09-16 ENCOUNTER — Encounter: Payer: Self-pay | Admitting: Obstetrics and Gynecology

## 2021-09-21 ENCOUNTER — Ambulatory Visit (INDEPENDENT_AMBULATORY_CARE_PROVIDER_SITE_OTHER): Payer: Medicaid Other | Admitting: Certified Nurse Midwife

## 2021-09-21 ENCOUNTER — Other Ambulatory Visit: Payer: Self-pay

## 2021-09-21 ENCOUNTER — Encounter: Payer: Self-pay | Admitting: Certified Nurse Midwife

## 2021-09-21 VITALS — BP 126/87 | HR 94 | Ht 60.0 in | Wt 140.4 lb

## 2021-09-21 DIAGNOSIS — Z30431 Encounter for routine checking of intrauterine contraceptive device: Secondary | ICD-10-CM | POA: Diagnosis not present

## 2021-09-21 NOTE — Progress Notes (Signed)
° °  GYNECOLOGY OFFICE ENCOUNTER NOTE  History:  26 y.o. G1P1001 here today for today for IUD string check; Mirena  IUD was placed  07/31/21. No complaints about the IUD, no concerning side effects.  The following portions of the patient's history were reviewed and updated as appropriate: allergies, current medications, past family history, past medical history, past social history, past surgical history and problem list.  Review of Systems:  Pertinent items are noted in HPI.   Objective:  Physical Exam Blood pressure 126/87, pulse 94, height 5' (1.524 m), weight 140 lb 6.4 oz (63.7 kg), unknown if currently breastfeeding. CONSTITUTIONAL: Well-developed, well-nourished female in no acute distress.  NEUROLOGIC: Alert and oriented to person, place, and time. Normal reflexes, muscle tone coordination.  PSYCHIATRIC: Normal mood and affect. Normal behavior. Normal judgment and thought content. CARDIOVASCULAR: Normal heart rate noted RESPIRATORY: Effort and breath sounds normal, no problems with respiration noted ABDOMEN: Soft, no distention noted.   PELVIC: Normal appearing external genitalia; normal appearing vaginal mucosa and cervix.  IUD strings visualized, about 3 cm in length outside cervix.    Assessment & Plan:  Patient to keep IUD in place for up to 7 years; can come in for removal earlier if she desires or for any concerning side effects. Recommended condoms for STI prevention.   Doreene Burke, CNM

## 2021-09-25 ENCOUNTER — Other Ambulatory Visit: Payer: Self-pay | Admitting: Obstetrics and Gynecology

## 2021-09-30 ENCOUNTER — Encounter: Payer: Medicaid Other | Admitting: Obstetrics and Gynecology

## 2022-01-15 ENCOUNTER — Telehealth: Payer: Medicaid Other | Admitting: Obstetrics and Gynecology

## 2022-01-28 ENCOUNTER — Encounter: Payer: Self-pay | Admitting: Physician Assistant

## 2022-01-29 ENCOUNTER — Encounter: Payer: Self-pay | Admitting: Obstetrics and Gynecology

## 2022-01-29 ENCOUNTER — Telehealth: Payer: Medicaid Other

## 2022-02-16 ENCOUNTER — Encounter: Payer: Self-pay | Admitting: Obstetrics and Gynecology

## 2022-02-16 ENCOUNTER — Telehealth (INDEPENDENT_AMBULATORY_CARE_PROVIDER_SITE_OTHER): Payer: Medicaid Other | Admitting: Obstetrics and Gynecology

## 2022-02-16 VITALS — Resp 16 | Ht 60.0 in | Wt 140.0 lb

## 2022-02-16 DIAGNOSIS — F419 Anxiety disorder, unspecified: Secondary | ICD-10-CM | POA: Diagnosis not present

## 2022-02-16 MED ORDER — FLUOXETINE HCL 20 MG PO CAPS: 20.0000 mg | ORAL_CAPSULE | Freq: Every day | ORAL | 1 refills | Status: AC

## 2022-02-16 NOTE — Progress Notes (Signed)
Virtual Visit via Video Note  I connected with Tasha Foster on 02/16/22 at  4:00 PM EDT by a video enabled telemedicine application and verified that I am speaking with the correct person using two identifiers.  Location: Patient: Home Provider: Office   I discussed the limitations of evaluation and management by telemedicine and the availability of in person appointments. The patient expressed understanding and agreed to proceed.  History of Present Illness: Tasha Foster is a 26 y.o. G4P1001 female who presents for discussion of anxiety symptoms. She reports that over the past several months she feels that it has been getting the best of her. She complains of feeling overly anxious about everything, especially her baby.  Constantly worried that something is going to happen to him. Also notes feeling stressed to the point that her family members are noticing how anxious she is. Goes through "worst case scenarios" of most daily events. Patient does have a h/o postpartum depression, was on Zoloft, however discontinued after several months as she noted the depression was improved, and did no feel that it was helping her anxiety. Has been trying to manage her anxiety on her own but notes it is getting worse. Her delivery was in     Observations/Objective:  Resp. rate 16, height 5' (1.524 m), weight 140 lb (63.5 kg), unknown if currently breastfeeding.      02/16/2022    4:10 PM 07/31/2021   11:43 AM 05/26/2021    4:35 PM 11/26/2020   10:34 AM  GAD 7 : Generalized Anxiety Score  Nervous, Anxious, on Edge 3 0 3 0  Control/stop worrying 3 0 3 0  Worry too much - different things 3 0 3 0  Trouble relaxing 3 0 3 0  Restless 0 0 3 0  Easily annoyed or irritable 3 0 2 0  Afraid - awful might happen 3 0 3 0  Total GAD 7 Score 18 0 20 0  Anxiety Difficulty Very difficult  Extremely difficult         02/16/2022    4:10 PM 07/31/2021   11:44 AM 11/26/2020   10:33 AM  Depression screen PHQ 2/9   Decreased Interest 0 0 0  Down, Depressed, Hopeless 0 0 0  PHQ - 2 Score 0 0 0  Altered sleeping 0 0 0  Tired, decreased energy 1 0 0  Change in appetite 0 0 0  Feeling bad or failure about yourself  0 0 0  Trouble concentrating 3 0 0  Moving slowly or fidgety/restless 3 0 0  Suicidal thoughts 0 0 0  PHQ-9 Score 7 0 0  Difficult doing work/chores Somewhat difficult       Assessment and Plan: Anxiety - Discussed options for counseling for trying a different medication. Patient ok to try new med. Will prescribe Prozac 20 mg. To f/u in 1 month to reassess symptoms.    Follow Up Instructions:    I discussed the assessment and treatment plan with the patient. The patient was provided an opportunity to ask questions and all were answered. The patient agreed with the plan and demonstrated an understanding of the instructions.   The patient was advised to call back or seek an in-person evaluation if the symptoms worsen or if the condition fails to improve as anticipated.  I provided 7 minutes of non-face-to-face time during this encounter.   Rubie Maid, MD [facility]

## 2022-03-24 ENCOUNTER — Telehealth: Payer: Self-pay

## 2022-03-24 ENCOUNTER — Telehealth: Payer: Medicaid Other | Admitting: Obstetrics and Gynecology

## 2022-06-05 IMAGING — US US OB FOLLOW-UP
1 series · 13 of 28 positions shown · non-contrast
Comparison: none

CLINICAL DATA: Follow-up incomplete fetal anatomy and growth.

EXAM:
OBSTETRIC 14+ WK ULTRASOUND FOLLOW-UP

[Series 1: us ob follow-up · 0.22mm/px · 13 of 55 slices shown]
[im 3/55]
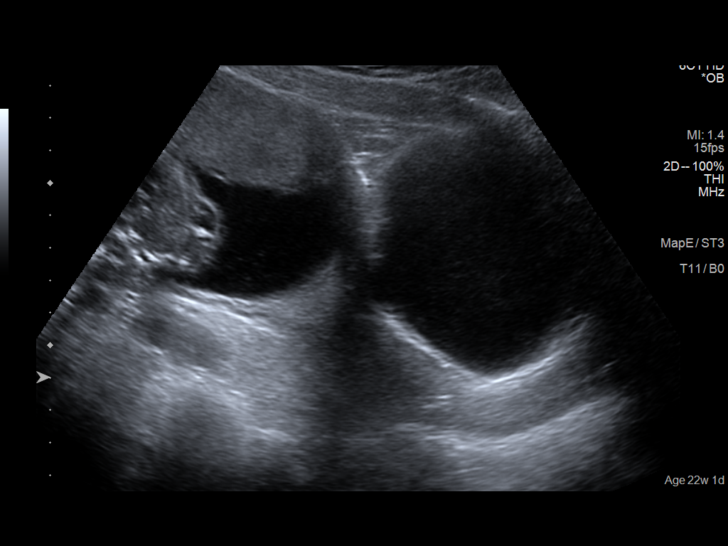
[im 7/55]
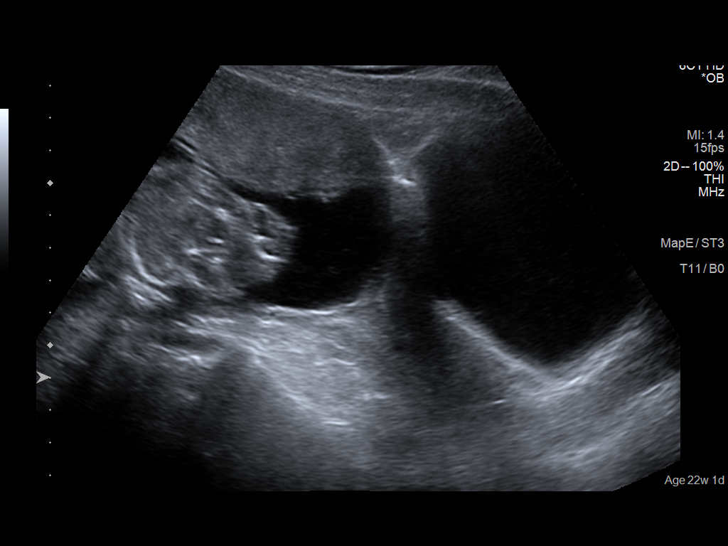
[im 11/55]
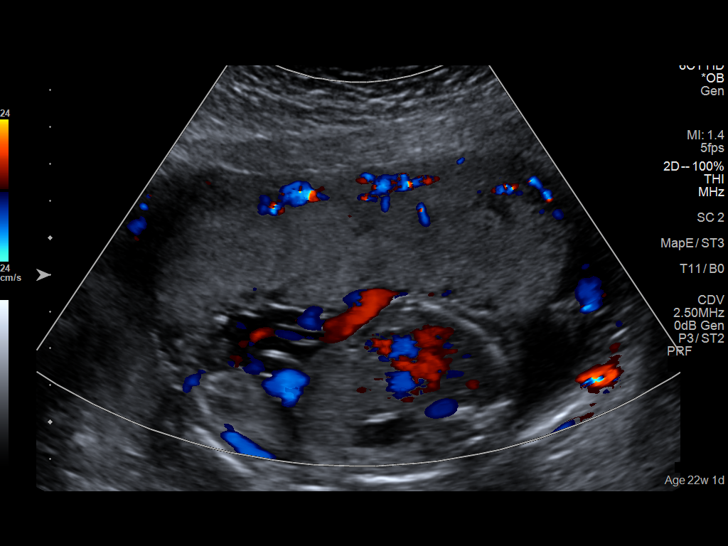
[im 15/55]
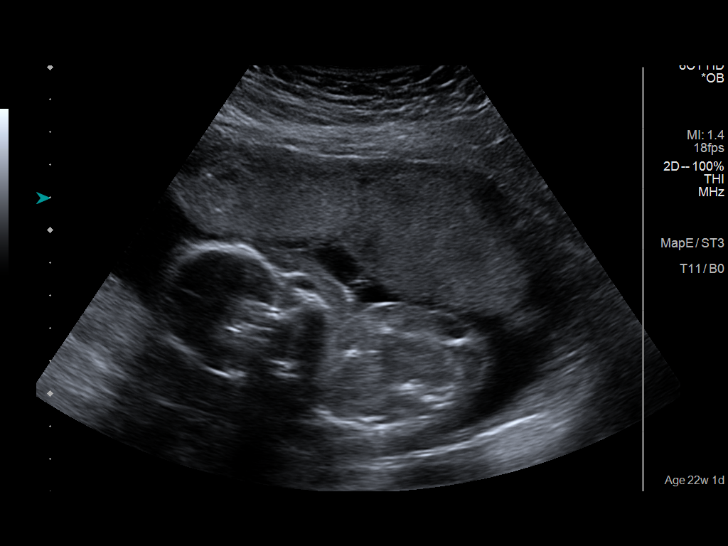
[im 19/55]
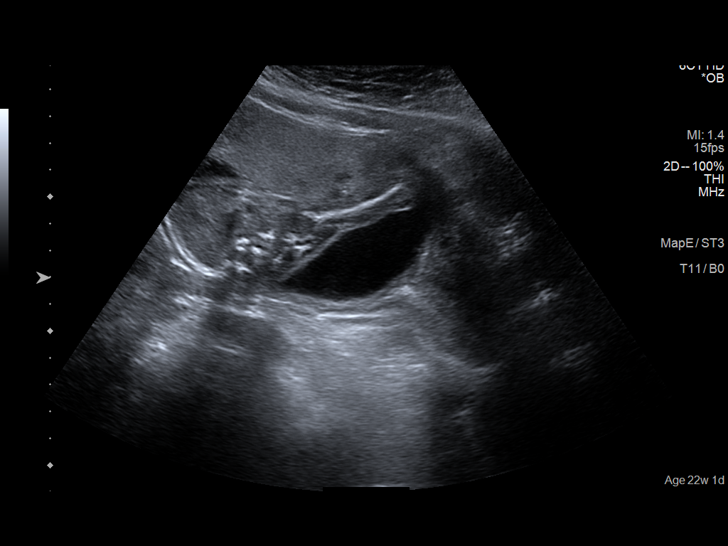
[im 23/55]
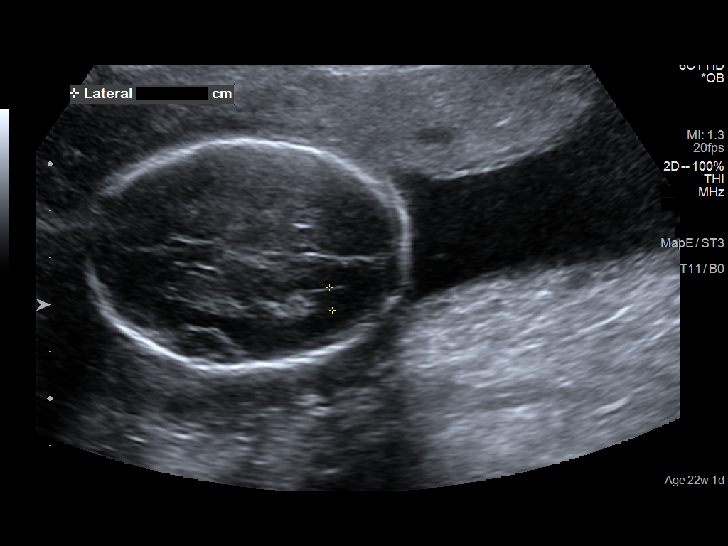
[im 29/55]
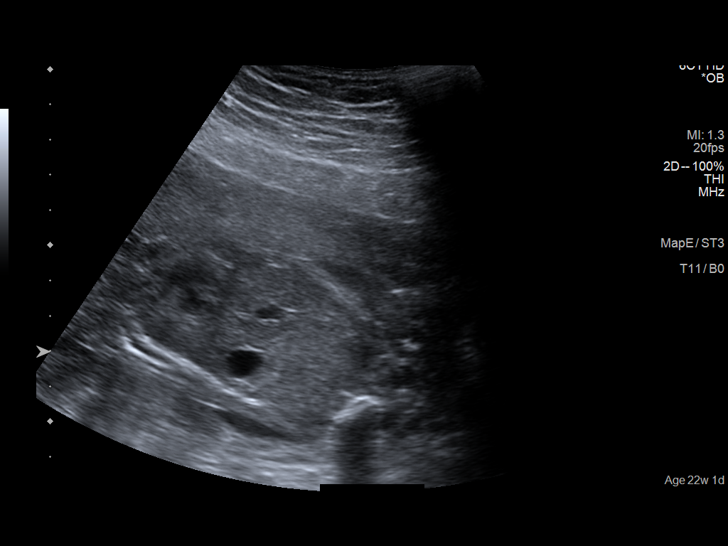
[im 33/55]
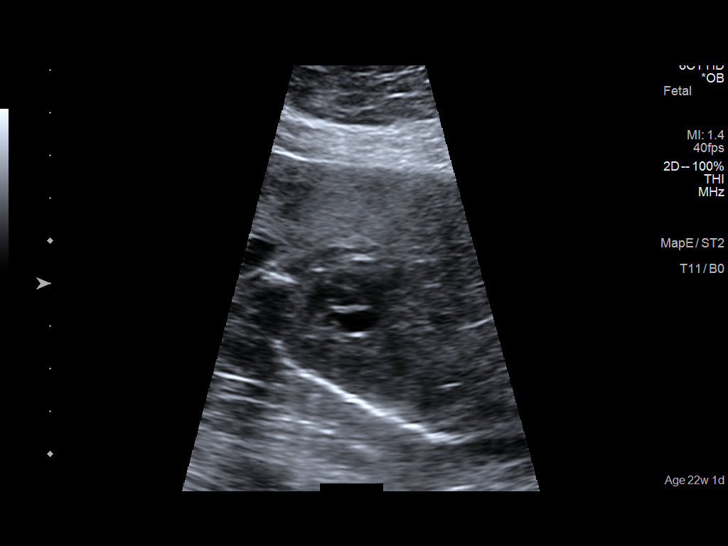
[im 37/55]
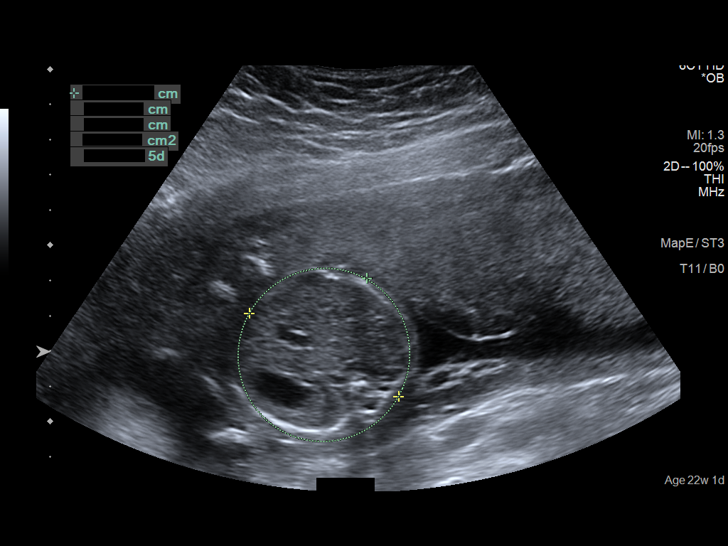
[im 41/55]
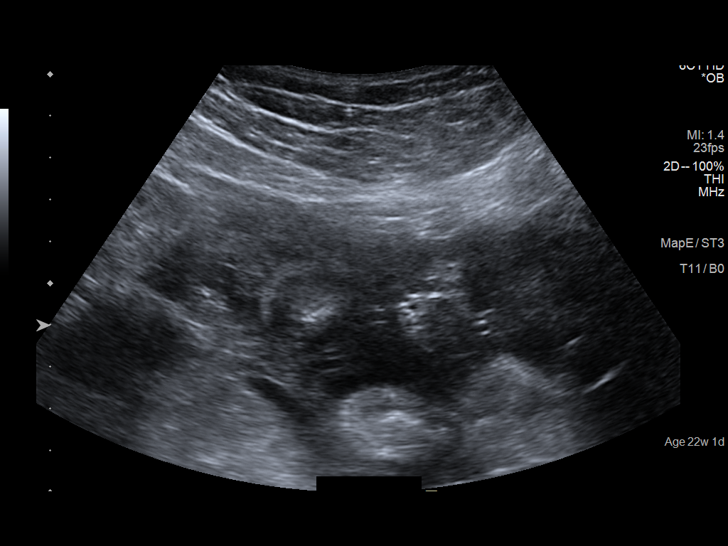
[im 45/55]
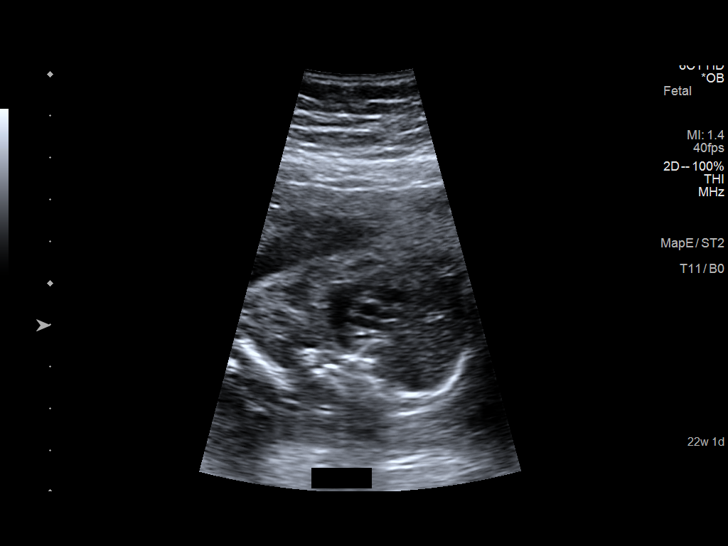
[im 49/55]
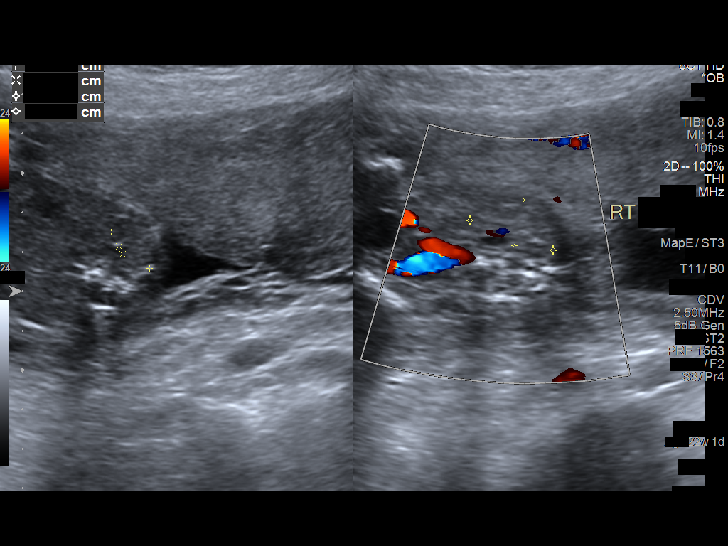
[im 53/55]
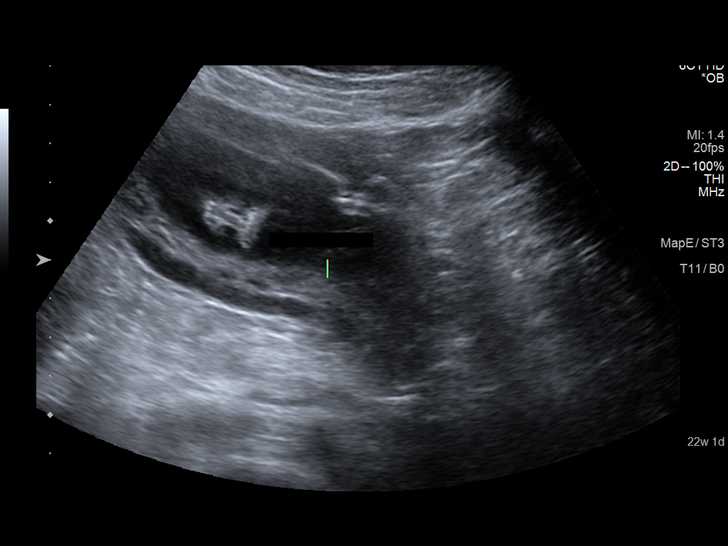

[13 of 28 positions shown; findings below may reference images not displayed]

FINDINGS: Number of Fetuses: 1

Heart Rate:  147 bpm

Movement: Yes

Presentation: Breech

Previa: No

Placental Location: Anterior

Amniotic Fluid (Subjective): Within normal limits

Amniotic Fluid (Objective):

Vertical pocket 2.3cm

FETAL BIOMETRY

BPD:  5.0cm 21w 0d

HC:    19.9cm 22w 0d

AC:    15.9cm 21w 0d

FL:    3.8cm 22w 1d

Current Mean GA: 21w 5d US EDC: 05/23/2021

Assigned GA: 22w 1d Assigned EDC: 05/20/2021

FETAL ANATOMY

Lateral Ventricles: Appears normal

Thalami/CSP: Appears normal

Posterior Fossa: Appears normal

Nuchal Region: Previously seen

Upper Lip: Appears normal

Spine: Previously seen

4 Chamber Heart on Left: Previously seen

LVOT: Appears normal

RVOT: Appears normal

Stomach on Left: Appears normal

3 Vessel Cord: Previously seen

Cord Insertion site: Previously seen

Kidneys: Appears normal

Bladder: Appears normal

Extremities: Previously seen

Sex: Previously Seen

Maternal Findings:

Cervix:  4.1 cm TA
IMPRESSION: Assigned GA currently 22 weeks 1 day.  Appropriate fetal growth.

Cardiac outflow tracts and upper lip visualized on today's study. No
fetal anomalies identified.

## 2023-09-13 NOTE — Progress Notes (Signed)
 Subjective:   HPI: Tasha Foster is a 27 y.o. female who presents for annual physical. She declined influenza and COVID vaccinations. She has no concerns other than the symptoms of brittle nails, higher BP, hot flashes, lightheadedness, thinning hair, and weight gain that we discussed at the last visit.    ROS: All Review of Systems negative unless noted in HPI   Health Maintenance: Counseled pt on exercise, diet, and weight     Recommended yearly dental and vision screenings Last colonoscopy: Colonoscopy date: Not Found N/A Last pap smear/HPV:  Pap Smear date: 11/18/2017 Repeat today Last mammogram: Mammogram date: Not Found N/A Lung CA screening: N/A   Last DEXA scan: DEXA date: Not Found N/A Hep C screening: HCV screening date: Not Found Ordered today Last TdaP: Overdue Tdap/Td immunization: No Last Pneumovax: Pneumonia vaccination: Not Found Annual flu shots? no Daily aspirin? Not Indicated  The ASCVD Risk score (Arnett DK, et al., 2019) failed to calculate for the following reasons:   The 2019 ASCVD risk score is only valid for ages 54 to 59  Note: For patients with SBP <90 or >200, Total Cholesterol <130 or >320, HDL <20 or >100 which are outside of the allowable range, the calculator will use these upper or lower values to calculate the patient's risk score.   - Statin?  Not Indicated  Current Medications:   Current Outpatient Medications  Medication Sig Dispense Refill  . busPIRone (BUSPAR) 10 MG tablet PLEASE SEE ATTACHED FOR DETAILED DIRECTIONS    . desvenlafaxine succinate (PRISTIQ) 100 MG 24 hr tablet Take 1 tablet (100 mg total) by mouth daily.    . hydrOXYzine (ATARAX) 50 MG tablet TAKE 1/2-1 TAB BY MOUTH AT BEDTIME AS NEEDED FOR SLEEP    . levonorgestrel  (MIRENA ) 21 mcg/24hr (up to 8 yrs) 52 mg IUD 1 each by Intrauterine route once.     No current facility-administered medications for this visit.    Objective:   Physical Exam: Vitals:   09/13/23  0757  BP: 152/104  BP Position: Sitting  BP Cuff Size: Large  Pulse: 103  Resp: 12  Temp: 36.8 C (98.3 F)  TempSrc: Skin  Weight: 69.9 kg (154 lb)  Height: 160 cm (5' 2.99)   Body mass index is 27.29 kg/m. Wt Readings from Last 3 Encounters:  09/13/23 69.9 kg (154 lb)  08/17/23 68.7 kg (151 lb 8 oz)  01/26/18 57.1 kg (125 lb 12.8 oz)   BP elevated. HR slightly tachy. Other VS reviewed and WNL.   Physical Exam  Gen: Pleasant, well-developed, well-nourished female, well appearing, in no acute distress. She is alert and oriented to person, place, and time. HEENT:  Head atraumatic and normocephalic, TM's clear bilaterally, PERRLA, extraocular muscles intact, no conjunctival injection or scleral icterus. Oropharynx moist and without lesions. Neck: Supple. With no cervical lymphadenopathy, thyromegaly or JVD present. Heart: Regular rate and rhythm, S1 and S2 are normal. No murmurs, rubs or gallops. Lungs: Clear to auscultation bilaterally without rales, rhonchi or wheezes. Abd:  Normoactive bowel sounds, abdomen soft, non-tender and non-distended. No hepatosplenomegaly. No rebound, guarding or masses.  Extremities: No clubbing, cyanosis, or edema. Pulses are +2 in upper and lower extremities bilaterally. Breast:  No abnormalities on inspection. No palpable masses, skin dimpling, rash, discharge or lymphadenopathy present bilaterally. GU:  Normal external female genitalia. Speculum exam reveals a normal vaginal vault and cervix. Dark red blood present (patient started spotting, period due). Pap with reflex HPV obtained.  Bimanual with no  cervical motion tenderness and no enlargement or tenderness of uterus, adnexa or ovaries. Neuro: CN 3-12 grossly intact, normal speech and normal gait. No focal deficits. Skin: No visible lesions or rashes.    Assessment/Plan:   Assessment & Plan Annual physical exam BP and HR elevated. Otherwise, unremarkable physical exam. Checking thyroid  function Orders: .  CBC; Future .  Comprehensive Metabolic Panel; Future .  Hemoglobin A1c; Future .  Thyroid Function Cascade; Future  Depression, unspecified depression type Stable. PHQ-9 score of 6, indicating mild depression. Continue taking Buspar, Pristiq, and atarax as prescribed by psychiatrist.     Pap smear for cervical cancer screening Moderate amount of dark red blood in vaginal vault. Patient started period just prior to exam. Advised she may have to return for repeat if pap is altered Orders: .  Pap Test  Need for hepatitis C screening test Ordered Orders: .  Hepatitis C Antibody; Future  Need for lipid screening Ordered Orders: .  Lipid Panel; Future  Vitamin D deficiency Ordered Orders: SABRA  Vitamin D 25 Hydroxy (25OH D2 + D3); Future   Return in about 1 month (around 10/14/2023) for weight loss meds .   Dr. Bernarda Gerold, DNP, FNP-BC

## 2024-03-16 ENCOUNTER — Encounter: Payer: Self-pay | Admitting: Certified Nurse Midwife

## 2024-04-05 ENCOUNTER — Ambulatory Visit: Admitting: Obstetrics

## 2024-04-24 ENCOUNTER — Ambulatory Visit: Admitting: Obstetrics & Gynecology
